# Patient Record
Sex: Female | Born: 1985 | Race: Black or African American | Hispanic: No | Marital: Single | Smoking: Never smoker
Health system: Southern US, Community
[De-identification: ages and names within clinical notes are randomized; demographics above are authoritative.]

## PROBLEM LIST (undated history)

## (undated) DIAGNOSIS — I10 Essential (primary) hypertension: Secondary | ICD-10-CM

## (undated) DIAGNOSIS — G5602 Carpal tunnel syndrome, left upper limb: Secondary | ICD-10-CM

## (undated) DIAGNOSIS — R519 Headache, unspecified: Secondary | ICD-10-CM

## (undated) DIAGNOSIS — F259 Schizoaffective disorder, unspecified: Secondary | ICD-10-CM

## (undated) DIAGNOSIS — G4700 Insomnia, unspecified: Secondary | ICD-10-CM

## (undated) DIAGNOSIS — E119 Type 2 diabetes mellitus without complications: Secondary | ICD-10-CM

## (undated) HISTORY — DX: Headache, unspecified: R51.9

## (undated) HISTORY — DX: Type 2 diabetes mellitus without complications: E11.9

## (undated) HISTORY — PX: WISDOM TOOTH EXTRACTION: SHX21

## (undated) HISTORY — DX: Schizoaffective disorder, unspecified: F25.9

## (undated) HISTORY — DX: Essential (primary) hypertension: I10

## (undated) HISTORY — DX: Carpal tunnel syndrome, left upper limb: G56.02

## (undated) HISTORY — DX: Insomnia, unspecified: G47.00

## (undated) HISTORY — PX: CARPAL TUNNEL RELEASE: SHX101

## (undated) HISTORY — PX: BREAST LUMPECTOMY: SHX2

---

## 2012-05-26 DIAGNOSIS — E119 Type 2 diabetes mellitus without complications: Secondary | ICD-10-CM

## 2012-05-26 HISTORY — DX: Type 2 diabetes mellitus without complications: E11.9

## 2012-07-26 ENCOUNTER — Encounter: Payer: Self-pay | Admitting: Obstetrics

## 2012-08-30 ENCOUNTER — Encounter: Payer: Self-pay | Admitting: Obstetrics & Gynecology

## 2012-09-24 ENCOUNTER — Encounter: Payer: Self-pay | Admitting: Obstetrics and Gynecology

## 2012-09-24 ENCOUNTER — Other Ambulatory Visit (HOSPITAL_COMMUNITY)
Admission: RE | Admit: 2012-09-24 | Discharge: 2012-09-24 | Disposition: A | Discharge status: Home / Self Care | Payer: Medicare Other | Source: Ambulatory Visit | Attending: Obstetrics and Gynecology | Admitting: Obstetrics and Gynecology

## 2012-09-24 ENCOUNTER — Ambulatory Visit (INDEPENDENT_AMBULATORY_CARE_PROVIDER_SITE_OTHER): Payer: Medicare Other | Admitting: Obstetrics and Gynecology

## 2012-09-24 VITALS — BP 112/78 | HR 95 | Temp 97.3°F | Wt 230.0 lb

## 2012-09-24 DIAGNOSIS — Z124 Encounter for screening for malignant neoplasm of cervix: Secondary | ICD-10-CM

## 2012-09-24 DIAGNOSIS — Z01419 Encounter for gynecological examination (general) (routine) without abnormal findings: Secondary | ICD-10-CM | POA: Insufficient documentation

## 2012-09-24 DIAGNOSIS — N912 Amenorrhea, unspecified: Secondary | ICD-10-CM

## 2012-09-24 DIAGNOSIS — N911 Secondary amenorrhea: Secondary | ICD-10-CM | POA: Insufficient documentation

## 2012-09-24 LAB — FOLLICLE STIMULATING HORMONE: FSH: 3 m[IU]/mL

## 2012-09-24 LAB — TSH: TSH: 1.108 u[IU]/mL (ref 0.350–4.500)

## 2012-09-24 MED ORDER — MEDROXYPROGESTERONE ACETATE 10 MG PO TABS: 10.0000 mg | ORAL_TABLET | Freq: Every day | ORAL | Status: DC

## 2012-09-24 NOTE — Progress Notes (Addendum)
  Subjective:    Patient ID: April [name], female    DOB: Jan 16, 1986, 27 y.o.   MRN: [id number]  HPI 27 yo G1P0101 with amenorrhea for several years presenting today for evaluation. Patient reports onset of menarche at 29. She states that she has always been regular with periods occuring on a monthly basis lasting 5-7 days. Patient states that after the birth of her son, 8 years ago, she started to experience irregular cycles. She would skip 2-3 months before having a normal period. Approximately 5 years ago, she became completely amenorrheic. She even reports onset of hot flushes and night sweats. Patient without any other complaints. She desires a pap smear today. Patient diagnosed with diabetes a year ago currently on oral glycemic agent. Patient with history of LEEP procedure. Patient has not been sexually active for at least 5 years. This is the first time the patient is being evaluated for this condition. UPT today is neagtive  Past Medical History  Diagnosis Date  . Diabetes mellitus without complication 05/2012   History reviewed. No pertinent past surgical history. Family History  Problem Relation Age of Onset  . Diabetes Maternal Aunt   . Diabetes Maternal Aunt    History  Substance Use Topics  . Smoking status: Never Smoker   . Smokeless tobacco: Never Used  . Alcohol Use: No      Review of Systems  All other systems reviewed and are negative.       Objective:   Physical Exam GENERAL: Well-developed, well-nourished female in no acute distress.  HEENT: Normocephalic, atraumatic. Sclerae anicteric.  NECK: Supple. Normal thyroid.  LUNGS: Clear to auscultation bilaterally.  HEART: Regular rate and rhythm. ABDOMEN: Soft, nontender, nondistended. Obese PELVIC: Normal external female genitalia. Vagina is pink and rugated.  Normal discharge. Normal appearing cervix. Uterus is normal in size. No adnexal mass or tenderness. EXTREMITIES: No cyanosis, clubbing, or edema, 2+  distal pulses.     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

## 2012-09-24 NOTE — Patient Instructions (Signed)
Secondary Amenorrhea   Secondary amenorrhea is the stopping of menstrual flow for 3 to 6 months in a female who has previously had periods. There are many possible causes. Most of these causes are not serious. Usually treating the underlying problem causing the loss of menses will return your periods to normal.  CAUSES   Some common and uncommon causes of not menstruating include:  · Malnutrition.  · Low blood sugar (hypoglycemia).  · Polycystic ovarian disease.  · Stress or fear.  · Breastfeeding.  · Hormone imbalance.  · Ovarian failure.  · Medications.  · Extreme obesity.  · Cystic fibrosis.  · Low body weight or drastic weight reduction from any cause.  · Early menopause.  · Removal of ovaries or uterus.  · Contraceptives.  · Illness.  · Long term (chronic) illnesses.  · Cushing's syndrome.  · Thyroid problems.  · Birth control pills, patches, or vaginal rings for birth control.  DIAGNOSIS   This diagnosis is made by your caregiver taking a medical history and doing a physical exam. Pregnancy must be ruled out. Often times, numerous blood tests of different hormones in the body may be measured. Urine testing may be done. Specialized x-rays may have to be done as well as measuring the body mass index (BMI).  TREATMENT   Treatment depends on the cause of the amenorrhea. If an eating disorder is present, this can be treated with an adequate diet and therapy. Chronic illnesses may improve with treatment of the illness. Overall, the outlook is good. The amenorrhea may be corrected with medications, lifestyle changes, or surgery. If the amenorrhea cannot be corrected, it is sometimes possible to create a false menstruation with medications.  Document Released: 05/23/2006 Document Revised: 07/04/2011 Document Reviewed: 03/30/2007  Exit[employer]® Patient Information ©2014 [employer].

## 2012-09-26 ENCOUNTER — Other Ambulatory Visit: Payer: Self-pay | Admitting: Obstetrics and Gynecology

## 2012-09-26 DIAGNOSIS — N911 Secondary amenorrhea: Secondary | ICD-10-CM

## 2012-09-28 ENCOUNTER — Ambulatory Visit (HOSPITAL_COMMUNITY): Payer: Medicare Other

## 2012-10-01 ENCOUNTER — Ambulatory Visit: Payer: Self-pay | Admitting: Obstetrics

## 2012-10-05 ENCOUNTER — Ambulatory Visit (HOSPITAL_COMMUNITY): Admission: RE | Admit: 2012-10-05 | Payer: Medicare Other | Source: Ambulatory Visit

## 2012-10-11 ENCOUNTER — Ambulatory Visit (HOSPITAL_COMMUNITY)
Admission: RE | Admit: 2012-10-11 | Discharge: 2012-10-11 | Disposition: A | Discharge status: Home / Self Care | Payer: Medicare Other | Source: Ambulatory Visit | Attending: Obstetrics and Gynecology | Admitting: Obstetrics and Gynecology

## 2012-10-11 DIAGNOSIS — N911 Secondary amenorrhea: Secondary | ICD-10-CM

## 2012-10-11 DIAGNOSIS — N912 Amenorrhea, unspecified: Secondary | ICD-10-CM | POA: Insufficient documentation

## 2012-10-11 DIAGNOSIS — E282 Polycystic ovarian syndrome: Secondary | ICD-10-CM | POA: Insufficient documentation

## 2012-10-19 ENCOUNTER — Encounter: Payer: Self-pay | Admitting: Obstetrics and Gynecology

## 2012-10-19 ENCOUNTER — Ambulatory Visit (INDEPENDENT_AMBULATORY_CARE_PROVIDER_SITE_OTHER): Payer: Medicare Other | Admitting: Obstetrics and Gynecology

## 2012-10-19 VITALS — BP 110/78 | HR 100 | Temp 98.0°F | Ht 64.0 in | Wt 234.7 lb

## 2012-10-19 DIAGNOSIS — Z01812 Encounter for preprocedural laboratory examination: Secondary | ICD-10-CM

## 2012-10-19 DIAGNOSIS — Z712 Person consulting for explanation of examination or test findings: Secondary | ICD-10-CM

## 2012-10-19 DIAGNOSIS — Z7189 Other specified counseling: Secondary | ICD-10-CM

## 2012-10-19 DIAGNOSIS — N912 Amenorrhea, unspecified: Secondary | ICD-10-CM

## 2012-10-19 DIAGNOSIS — Z30430 Encounter for insertion of intrauterine contraceptive device: Secondary | ICD-10-CM

## 2012-10-19 MED ORDER — LEVONORGESTREL 20 MCG/24HR IU IUD
INTRAUTERINE_SYSTEM | Freq: Once | INTRAUTERINE | Status: AC
  Administered 2012-10-19: 1 via INTRAUTERINE

## 2012-10-19 NOTE — Progress Notes (Signed)
Patient ID: April [name], female   DOB: 04-09-1986, 27 y.o.   MRN: [id number] 27 yo G1P0101 with LMP 10/08/2012 presenting today to discuss lab results for work up of secondary amenorrhea. Discussed lab results and concerns for prolactin levels in upper limits of normal which are suppressing ovarian function. Patient reports that one of her medication is causing her to have elevated prolactin and she is being followed for that in [facility]. At one point her levels were so high that she experienced milky breast discharge.  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  Urine pregnancy test negative.  Speculum placed in the vagina.  Cervix visualized.  Cleaned with Betadine x 2.  Grasped anteriorly with a single tooth tenaculum.  Uterus sounded to 7 cm.  Mirena IUD placed per manufacturer's recommendations.  Strings trimmed to 3 cm. Tenaculum was removed, good hemostasis noted.  Patient tolerated procedure well.   Patient given post procedure instructions and Mirena care card with expiration date.  Patient is asked to check IUD strings periodically and follow up in 4-6 weeks for IUD check.

## 2012-10-29 ENCOUNTER — Encounter: Payer: Self-pay | Admitting: *Deleted

## 2012-11-19 ENCOUNTER — Ambulatory Visit (INDEPENDENT_AMBULATORY_CARE_PROVIDER_SITE_OTHER): Payer: Medicare Other | Admitting: Obstetrics and Gynecology

## 2012-11-19 ENCOUNTER — Encounter: Payer: Self-pay | Admitting: Obstetrics and Gynecology

## 2012-11-19 VITALS — BP 123/80 | HR 89 | Temp 97.1°F | Ht 64.0 in | Wt 231.9 lb

## 2012-11-19 DIAGNOSIS — Z30431 Encounter for routine checking of intrauterine contraceptive device: Secondary | ICD-10-CM

## 2012-11-19 NOTE — Progress Notes (Signed)
Pt here for string check. Visualized with speculum. Present. Pt able to have intercourse at this time 4wks after insertion.  Pt complains of bleeding. Explained normal for IUD and should improve. No complaints of anemia sx.  Pt complains of cramping. Recommend monitor for up to 6 months as body returns to baseline.

## 2013-02-19 ENCOUNTER — Encounter: Payer: Medicare Other | Attending: *Deleted | Admitting: *Deleted

## 2013-02-19 ENCOUNTER — Encounter: Payer: Self-pay | Admitting: *Deleted

## 2013-02-19 VITALS — Ht 64.0 in | Wt 232.0 lb

## 2013-02-19 DIAGNOSIS — Z713 Dietary counseling and surveillance: Secondary | ICD-10-CM | POA: Insufficient documentation

## 2013-02-19 DIAGNOSIS — E669 Obesity, unspecified: Secondary | ICD-10-CM

## 2013-02-19 DIAGNOSIS — E119 Type 2 diabetes mellitus without complications: Secondary | ICD-10-CM

## 2013-02-19 NOTE — Progress Notes (Signed)
Appt start time: 0930 end time:  1030.  Assessment:  Patient was seen on  02/19/13 for individual diabetes education. She has been diagnosed for about 18 months and is not well-controlled.  She takes multiple medications, but her diet quality is poor, she is inactive, and she does not routinely monitor her blood glucose. April [name] is medically disabled and she lives with her mother.  They share shopping and cooking responsibilities.  Most of the foods are fried, but sometimes they bake.  They might eat out maybe once a week. April [name] has a very irregular sleep and eating pattern.  She sleeps from 2 am until 5 pm.  Then when she is awake she eats constantly.  Her diet is composed of excessive amount of refined carbohydrates   Current HbA1c: 8%  Preferred Learning Style:   Visual  Hands on   Learning Readiness:   Contemplating   MEDICATIONS: see list.  Invokana and Janumet for diabetes  DIETARY INTAKE:  Usual eating pattern includes multiple meals and multiple snacks per day.  Everyday foods include refined carbohydrates and fatty proteins.  Very seldom eats fruit or vegetables.    24-hr recall:  Sleeps until 5pm. Has bowl(s) cereal (honey nut chex with 2% milk) About 9 pm might have sandwich Will make another bowl of cereal  And another sandwich in the night.  Will stop eating around 2 am Drinks: sugar-free Hawaiian punch and water.  Diet coke with meals.  Sometimes sweet tea.  Her medications have her sleep schedule off.  She has tried to sleep normally,. But then a nap.    Usual physical activity: walks once every 2 weeks  Estimated energy needs: 1800 calories 200 g carbohydrates 135 g protein 50 g fat  Progress Towards Goal(s):  In progress.   Nutritional Diagnosis:  NB-1.5 Disordered eating pattern As related to erratic meals and limited nutritent content.  As evidenced by dietary recall, obesity, and DM.    Intervention:  Nutrition counseling provided.  Discussed  diabetes disease process and treatment options.  Discussed physiology of diabetes and role of obesity on insulin resistance.  Encouraged moderate weight reduction to improve glucose levels.  Discussed role of medications and diet in glucose control  Provided education on macronutrients on glucose levels.  Provided education on carb counting, importance of regularly scheduled meals/snacks, and meal planning  Discussed effects of physical activity on glucose levels and long-term glucose control.  Recommended 150 minutes of physical activity/week.  Reviewed patient medications.  Discussed role of medication on blood glucose and possible side effects  Discussed blood glucose monitoring and interpretation.  Discussed recommended target ranges and individual ranges.    Described short-term complications: hyper- and hypo-glycemia.  Discussed causes,symptoms, and treatment options.  Discussed prevention, detection, and treatment of long-term complications.  Discussed the role of prolonged elevated glucose levels on body systems.  Discussed recommendations for long-term diabetes self-care.  Established checklist for medical, dental, and emotional self-care.  Teaching Method Utilized:  Visual Auditory Hands on  Handouts given during visit include: Living Well with Diabetes Carb Counting and Food Label handouts Meal Plan Card  Barriers to learning/adherence to lifestyle change: schedule  Diabetes self-care support plan:   [facility] support group  mom  Demonstrated degree of understanding via:  Teach Back   Monitoring/Evaluation:  Dietary intake, exercise, BGM, and body weight in 3 month(s).

## 2013-05-22 ENCOUNTER — Encounter: Payer: Medicare Other | Attending: *Deleted | Admitting: *Deleted

## 2013-05-22 VITALS — Wt 225.0 lb

## 2013-05-22 DIAGNOSIS — E669 Obesity, unspecified: Secondary | ICD-10-CM | POA: Insufficient documentation

## 2013-05-22 DIAGNOSIS — Z713 Dietary counseling and surveillance: Secondary | ICD-10-CM | POA: Insufficient documentation

## 2013-05-22 DIAGNOSIS — E119 Type 2 diabetes mellitus without complications: Secondary | ICD-10-CM | POA: Insufficient documentation

## 2013-05-22 NOTE — Progress Notes (Signed)
Appt start time: 1600 end time:  1630.  Assessment:  April [name] is here for follow up diabetes education.   She is here with her young son and her mother, whom she lives with.  She reports that she Changed dietary habits: wheat bread, multigain cheerios.  However she has no made any other changes.  She still has a very erratic and irregular meal pattern and is inactive.  Her A1c dropped form 8%-7%   Current HbA1c: 7.0%  Preferred Learning Style:   Visual  Hands on   Learning Readiness:   Contemplating   MEDICATIONS: see list.  Invokana and Janumet for diabetes  DIETARY INTAKE:  Usual eating pattern includes multiple meals and multiple snacks per day.  Everyday foods include refined carbohydrates and fatty proteins.  Very seldom eats fruit or vegetables.    24-hr recall:  Sleeps until 6pm. Has sandwich and chips with diet soda and bowl cereal 8 pm Sugar-free Russel Stover pecan delights (3-4 candies) 10 pm chicken sandwich or cooks terryaki chicken and rice  Fruit smoothie 3 times a week  Her medications have her sleep schedule off.  She has tried to sleep normally,. But then a nap.    Usual physical activity: walks once every 2 weeks  Estimated energy needs: 1800 calories 200 g carbohydrates 135 g protein 50 g fat  Progress Towards Goal(s):  Some progress.   Nutritional Diagnosis:  NB-1.5 Disordered eating pattern As related to erratic meals and limited nutritent content.  As evidenced by dietary recall, obesity, and DM.    Intervention:  Nutrition counseling provided.  Discussed effects of physical activity on glucose levels and long-term glucose control.  Recommended 150 minutes of physical activity/week.  Teaching Method Utilized:  Auditory   Barriers to learning/adherence to lifestyle change: irregular schedule  Diabetes self-care support plan:  mom  Demonstrated degree of understanding via:  Teach Back   Monitoring/Evaluation:  Dietary intake,  exercise, BGM, and body weight in 3 month(s).

## 2013-08-20 ENCOUNTER — Encounter: Payer: Medicare Other | Attending: *Deleted | Admitting: *Deleted

## 2013-08-20 VITALS — Wt 230.8 lb

## 2013-08-20 DIAGNOSIS — Z713 Dietary counseling and surveillance: Secondary | ICD-10-CM | POA: Insufficient documentation

## 2013-08-20 DIAGNOSIS — E669 Obesity, unspecified: Secondary | ICD-10-CM | POA: Insufficient documentation

## 2013-08-20 DIAGNOSIS — E119 Type 2 diabetes mellitus without complications: Secondary | ICD-10-CM | POA: Insufficient documentation

## 2013-08-20 NOTE — Patient Instructions (Signed)
Add lettuce and tomato on sandwich Add cantaloupe as a side  Keep up the great work!!! Aim to walk 3 days And zumba once a week

## 2013-08-20 NOTE — Progress Notes (Signed)
Appt start time: 1600 end time:  1630.  Assessment:  April [name] is here for follow up diabetes education.   Stopped eating after midnight and increase physical activity: Zumba and walking.  She states she feels good!!  She thinks she has lost weight.  She has started checking her blood glucose and her numbers are WNL.  Her most recent HbA1c is 6.2%!!!  She started snacking on nuts instead of sugar-free candy.  She started eating whole grain bread and leaner sandwich meats.    She reports that her attitude has changed  Preferred Learning Style:   Visual  Hands on   Learning Readiness:   Change in progress   MEDICATIONS: see list.  Invokana and Janumet for diabetes  DIETARY INTAKE:  Usual eating pattern includes multiple meals and multiple snacks per day.  Everyday foods include carbohydrates and proteins.  Very seldom eats fruit or vegetables.    24-hr recall:  Wake up earlier around 4: cereal or sandwich Might walk or go to Zumba Take a nap Around 9 pm: chicken sandwich on whole grain.  Some kind of sandwich Goes back to sleep Beverages: water or G2 or crystal ligjt  Usual physical activity: walks more and goes to zumba  Estimated energy needs: 1800 calories 200 g carbohydrates 135 g protein 50 g fat  Progress Towards Goal(s):  Some progress.   Nutritional Diagnosis:  NB-1.5 Disordered eating pattern As related to erratic meals and limited nutritent content.  As evidenced by dietary recall, obesity, and DM.    Intervention:  Nutrition counseling provided. Goals: Add lettuce and tomato on sandwich Add cantaloupe as a side Keep up the great work!!! Aim to walk 3 days And zumba once a week  Teaching Method Utilized:  Auditory   Barriers to learning/adherence to lifestyle change: irregular schedule, sleepiness. motivation  Diabetes self-care support plan:  mom  Demonstrated degree of understanding via:  Teach Back   Monitoring/Evaluation:  Dietary intake,  exercise, BGM, and body weight in 3 month(s).

## 2013-11-19 ENCOUNTER — Encounter: Payer: Medicare Other | Attending: *Deleted | Admitting: *Deleted

## 2013-11-19 DIAGNOSIS — Z713 Dietary counseling and surveillance: Secondary | ICD-10-CM | POA: Insufficient documentation

## 2013-11-19 DIAGNOSIS — E119 Type 2 diabetes mellitus without complications: Secondary | ICD-10-CM | POA: Insufficient documentation

## 2013-11-19 DIAGNOSIS — E669 Obesity, unspecified: Secondary | ICD-10-CM | POA: Insufficient documentation

## 2013-11-19 NOTE — Patient Instructions (Signed)
Aim to walk 4 days each week Wait 30 minutes after you eat before getting cereal.  If you're not hungry, don't eat it! Limit to 1 bowl of cereal if you do eat some

## 2013-11-19 NOTE — Progress Notes (Signed)
Appt start time: 1600 end time:  1630.  Assessment:  April [name] is here with her mom and son for follow up nutrition counseling pertaining to type 2 diabetes.  Her most recent A1c is 7%, up from 6.2%.  Her mom is dissapointed.  April [name] thinks her glucose went up because she's been eating sweets daily.  She eats "the right portion, but just too often."  I doubt her portion is small because her portions of all her other foods are large.  She is mostly inactive, sleeps all day, and eats carbohydrates all through the night.  Mom tries to motivate her and help her, but April [name] has been reluctant to make changes.      Preferred Learning Style:   Visual  Hands on   Learning Readiness:   Change in progress   MEDICATIONS: see list.  Invokana and Janumet for diabetes  DIETARY INTAKE:  Usual eating pattern includes multiple meals and multiple snacks per day.  Everyday foods include carbohydrates and proteins.  Very seldom eats fruit or vegetables.    24-hr recall:  Wake up earlier around 4:  Later has chicken salad or terriyaki chicken and rice or sandwich ([place] and cheese with honey mustard on whole wheat bread) with sugar-free beverage Couple bowls cereal or sandwich Might walk or go to Zumba once a week Around 9 pm: chicken sandwich on whole grain.  Some kind of sandwich More cereal Goes back to sleep Beverages: water or G2 or crystal ligjt  Usual physical activity: walks once a week  Estimated energy needs: 1800 calories 200 g carbohydrates 135 g protein 50 g fat  Progress Towards Goal(s):  Some progress.   Nutritional Diagnosis:  NB-1.5 Disordered eating pattern As related to erratic meals and limited nutritent content.  As evidenced by dietary recall, obesity, and DM.    Intervention:  Nutrition counseling provided.  Discussed MyPlate recommendations and reviewed what food affect glucose (ie carbohydrates).  Discussed carb count of her favorite foods (cereal, milk, and  sandwiches).  April [name] was amazed at how many carb she's consuming at night.  Discussed possibility of her waking up and eating balanced breakfast, going back to sleep, getting balanced lunch, taking nap if needed, and then balanced dinner.  Reiterated importance of physical activity.  Mom agrees to walk with her to keep her motivated.     Goals: Aim to walk 4 days each week Wait 30 minutes after you eat before getting cereal.  If you're not hungry, don't eat it! Limit to 1 bowl of cereal if you do eat some  Teaching Method Utilized:  Auditory  Handout given: MyPlate for Diabetes   Barriers to learning/adherence to lifestyle change: irregular schedule, sleepiness. motivation  Diabetes self-care support plan:  mom  Demonstrated degree of understanding via:  Teach Back   Monitoring/Evaluation:  Dietary intake, exercise, BGM, and body weight in 3 month(s).

## 2014-02-17 ENCOUNTER — Ambulatory Visit: Payer: Medicare Other | Admitting: *Deleted

## 2014-02-24 ENCOUNTER — Encounter: Payer: Self-pay | Admitting: *Deleted

## 2014-02-24 ENCOUNTER — Ambulatory Visit: Payer: Medicare Other | Admitting: *Deleted

## 2016-01-28 ENCOUNTER — Ambulatory Visit (INDEPENDENT_AMBULATORY_CARE_PROVIDER_SITE_OTHER): Payer: Medicare Other | Admitting: Obstetrics and Gynecology

## 2016-01-28 ENCOUNTER — Other Ambulatory Visit (HOSPITAL_COMMUNITY)
Admission: RE | Admit: 2016-01-28 | Discharge: 2016-01-28 | Disposition: A | Discharge status: Home / Self Care | Payer: Medicare Other | Source: Ambulatory Visit | Attending: Obstetrics and Gynecology | Admitting: Obstetrics and Gynecology

## 2016-01-28 ENCOUNTER — Encounter: Payer: Self-pay | Admitting: Obstetrics and Gynecology

## 2016-01-28 VITALS — BP 119/75 | HR 98 | Wt 225.0 lb

## 2016-01-28 DIAGNOSIS — Z1151 Encounter for screening for human papillomavirus (HPV): Secondary | ICD-10-CM

## 2016-01-28 DIAGNOSIS — Z113 Encounter for screening for infections with a predominantly sexual mode of transmission: Secondary | ICD-10-CM

## 2016-01-28 DIAGNOSIS — Z01419 Encounter for gynecological examination (general) (routine) without abnormal findings: Secondary | ICD-10-CM

## 2016-01-28 DIAGNOSIS — Z124 Encounter for screening for malignant neoplasm of cervix: Secondary | ICD-10-CM

## 2016-01-28 NOTE — Progress Notes (Signed)
Subjective:     Charlcie Rubye OaksDickerson is a 30 y.o. female G1P1 with BMI 6638 who is here for a comprehensive physical exam. The patient reports no problems. She is not sexually active. She is using Mirena for contraception and cycle control. She has been amenorrheic since its insertion in 2014. Patient denies any abdominal/pelvic pain or abnormal discharge  Past Medical History:  Diagnosis Date  . Diabetes mellitus without complication (HCC) 05/2012  . Hypertension    No past surgical history on file. Family History  Problem Relation Age of Onset  . Diabetes Maternal Aunt   . Diabetes Maternal Aunt     Social History   Social History  . Marital status: Single    Spouse name: N/A  . Number of children: N/A  . Years of education: N/A   Occupational History  . Not on file.   Social History Main Topics  . Smoking status: Never Smoker  . Smokeless tobacco: Never Used  . Alcohol use No  . Drug use: No  . Sexual activity: No   Other Topics Concern  . Not on file   Social History Narrative  . No narrative on file   Health Maintenance  Topic Date Due  . HEMOGLOBIN A1C  06-18-1984  . PNEUMOCOCCAL POLYSACCHARIDE VACCINE (1) 07/28/1986  . FOOT EXAM  07/28/1994  . OPHTHALMOLOGY EXAM  07/28/1994  . HIV Screening  07/28/1999  . TETANUS/TDAP  07/28/2003  . PAP SMEAR  09/25/2015  . INFLUENZA VACCINE  11/24/2015       Review of Systems Pertinent items are noted in HPI.   Objective:  Blood pressure 119/75, pulse 98, weight 225 lb (102.1 kg).     GENERAL: Well-developed, well-nourished female in no acute distress.  HEENT: Normocephalic, atraumatic. Sclerae anicteric.  NECK: Supple. Normal thyroid.  LUNGS: Clear to auscultation bilaterally.  HEART: Regular rate and rhythm. BREASTS: Symmetric in size. No palpable masses or lymphadenopathy, skin changes, or nipple drainage. ABDOMEN: Soft, nontender, nondistended. No organomegaly. PELVIC: Normal external female genitalia. Vagina  is pink and rugated.  Normal discharge. Normal appearing cervix. Uterus is normal in size. No adnexal mass or tenderness. EXTREMITIES: No cyanosis, clubbing, or edema, 2+ distal pulses.    Assessment:    Healthy female exam.      Plan:    Pap smear collected Patient desires STI testing which was ordered and collected Patient will be contacted with any abnormal results Discussed weight loss strategies RTC prn See After Visit Summary for Counseling Recommendations

## 2016-01-29 LAB — RPR

## 2016-01-29 LAB — CYTOLOGY - PAP

## 2016-01-29 LAB — HEPATITIS B SURFACE ANTIGEN: Hepatitis B Surface Ag: NEGATIVE

## 2016-01-29 LAB — HIV ANTIBODY (ROUTINE TESTING W REFLEX): HIV: NONREACTIVE

## 2017-11-09 ENCOUNTER — Ambulatory Visit (INDEPENDENT_AMBULATORY_CARE_PROVIDER_SITE_OTHER): Payer: Medicare Other | Admitting: Obstetrics and Gynecology

## 2017-11-09 ENCOUNTER — Encounter: Payer: Self-pay | Admitting: Obstetrics and Gynecology

## 2017-11-09 ENCOUNTER — Other Ambulatory Visit (HOSPITAL_COMMUNITY)
Admission: RE | Admit: 2017-11-09 | Discharge: 2017-11-09 | Disposition: A | Discharge status: Home / Self Care | Payer: Medicare Other | Source: Ambulatory Visit | Attending: Obstetrics and Gynecology | Admitting: Obstetrics and Gynecology

## 2017-11-09 VITALS — BP 121/97 | HR 89 | Ht 64.0 in | Wt 234.0 lb

## 2017-11-09 DIAGNOSIS — Z30430 Encounter for insertion of intrauterine contraceptive device: Secondary | ICD-10-CM | POA: Diagnosis not present

## 2017-11-09 DIAGNOSIS — Z01419 Encounter for gynecological examination (general) (routine) without abnormal findings: Secondary | ICD-10-CM | POA: Diagnosis present

## 2017-11-09 DIAGNOSIS — Z975 Presence of (intrauterine) contraceptive device: Secondary | ICD-10-CM

## 2017-11-09 DIAGNOSIS — Z202 Contact with and (suspected) exposure to infections with a predominantly sexual mode of transmission: Secondary | ICD-10-CM

## 2017-11-09 LAB — POCT PREGNANCY, URINE: PREG TEST UR: NEGATIVE

## 2017-11-09 MED ORDER — LEVONORGESTREL 19.5 MCG/DAY IU IUD
INTRAUTERINE_SYSTEM | Freq: Once | INTRAUTERINE | Status: AC
  Administered 2017-11-09: 15:00:00 via INTRAUTERINE

## 2017-11-09 NOTE — Addendum Note (Signed)
Addended by: Ernestina PatchesAPEL, Chaquana Nichols S on: 11/09/2017 03:24 PM   Modules accepted: Orders

## 2017-11-09 NOTE — Progress Notes (Signed)
Subjective:     April [name] is a 32 y.o. female P1 with BMI 40 and Mirena-IUD induced amnorrhea who is here for a comprehensive physical exam. The patient reports no problems. She is sexually active using IUD for contraception. IUD is due to removal and patient desires a re-insertion. Patient denies any pelvic pain or abnormal discharge.  Past Medical History:  Diagnosis Date  . Diabetes mellitus without complication (HCC) 05/2012  . Hypertension    No past surgical history on file. Family History  Problem Relation Age of Onset  . Diabetes Maternal Aunt   . Diabetes Maternal Aunt      Social History   Socioeconomic History  . Marital status: Single    Spouse name: Not on file  . Number of children: Not on file  . Years of education: Not on file  . Highest education level: Not on file  Occupational History  . Not on file  Social Needs  . Financial resource strain: Not on file  . Food insecurity:    Worry: Not on file    Inability: Not on file  . Transportation needs:    Medical: Not on file    Non-medical: Not on file  Tobacco Use  . Smoking status: Never Smoker  . Smokeless tobacco: Never Used  Substance and Sexual Activity  . Alcohol use: No  . Drug use: No  . Sexual activity: Never    Birth control/protection: None  Lifestyle  . Physical activity:    Days per week: Not on file    Minutes per session: Not on file  . Stress: Not on file  Relationships  . Social connections:    Talks on phone: Not on file    Gets together: Not on file    Attends religious service: Not on file    Active member of club or organization: Not on file    Attends meetings of clubs or organizations: Not on file    Relationship status: Not on file  . Intimate partner violence:    Fear of current or ex partner: Not on file    Emotionally abused: Not on file    Physically abused: Not on file    Forced sexual activity: Not on file  Other Topics Concern  . Not on file  Social  History Narrative  . Not on file   Health Maintenance  Topic Date Due  . HEMOGLOBIN A1C  [birth year]  . PNEUMOCOCCAL POLYSACCHARIDE VACCINE (1) 07/28/1986  . FOOT EXAM  07/28/1994  . OPHTHALMOLOGY EXAM  07/28/1994  . TETANUS/TDAP  07/28/2003  . INFLUENZA VACCINE  11/23/2017  . PAP SMEAR  01/28/2019  . HIV Screening  Completed       Review of Systems Pertinent items are noted in HPI.   Objective:  Blood pressure (!) 121/97, pulse 89, height 5\' 4"  (1.626 m), weight 234 lb (106.1 kg). GENERAL: Well-developed, well-nourished female in no acute distress.  HEENT: Normocephalic, atraumatic. Sclerae anicteric.  NECK: Supple. Normal thyroid.  LUNGS: Clear to auscultation bilaterally.  HEART: Regular rate and rhythm. BREASTS: Symmetric in size. No palpable masses or lymphadenopathy, skin changes, or nipple drainage. ABDOMEN: Soft, nontender, nondistended. No organomegaly. PELVIC: Normal external female genitalia. Vagina is pink and rugated.  Normal discharge. Normal appearing cervix with IUD strings visualized at the os. Uterus is normal in size. No adnexal mass or tenderness. EXTREMITIES: No cyanosis, clubbing, or edema, 2+ distal pulses.       Assessment:    Healthy female  exam.      Plan:    Pap smear collected IUD Removal  Patient identified, informed consent performed, consent signed.  Patient was in the dorsal lithotomy position, normal external genitalia was noted.  A speculum was placed in the patient's vagina, normal discharge was noted, no lesions. The cervix was visualized, no lesions, no abnormal discharge.  The strings of the IUD were grasped and pulled using ring forceps. The IUD was removed in its entirety. Patient tolerated the procedure well.    IUD Insertion Patient identified, informed consent performed, signed copy in chart, time out was performed.  Urine pregnancy test negative.  Speculum placed in the vagina.  Cervix visualized.  Cleaned with Betadine x 2.   Grasped anteriorly with a single tooth tenaculum.  Uterus sounded to 8 cm.  Mirena IUD placed per manufacturer's recommendations.  Strings trimmed to 3 cm. Tenaculum was removed, good hemostasis noted.  Patient tolerated procedure well.   Patient given post procedure instructions and Mirena care card with expiration date.  Patient is asked to check IUD strings periodically and follow up in 4-6 weeks for IUD check.     See After Visit Summary for Counseling Recommendations

## 2017-11-10 LAB — CYTOLOGY - PAP
Chlamydia: NEGATIVE
DIAGNOSIS: NEGATIVE
HPV (WINDOPATH): NOT DETECTED
NEISSERIA GONORRHEA: NEGATIVE
TRICH (WINDOWPATH): NEGATIVE

## 2017-11-10 LAB — HEPATITIS B SURFACE ANTIGEN: Hepatitis B Surface Ag: NEGATIVE

## 2017-11-10 LAB — RPR: RPR Ser Ql: NONREACTIVE

## 2017-11-10 LAB — HEPATITIS C ANTIBODY: Hep C Virus Ab: <0.1 s/co ratio (ref 0.0–0.9)

## 2017-11-10 LAB — HIV ANTIBODY (ROUTINE TESTING W REFLEX): HIV SCREEN 4TH GENERATION: NONREACTIVE

## 2017-12-14 ENCOUNTER — Encounter: Payer: Self-pay | Admitting: Obstetrics and Gynecology

## 2017-12-14 ENCOUNTER — Ambulatory Visit: Payer: Medicare Other | Admitting: Obstetrics and Gynecology

## 2017-12-14 VITALS — BP 136/89 | HR 76 | Ht 64.0 in | Wt 243.0 lb

## 2017-12-14 DIAGNOSIS — Z30431 Encounter for routine checking of intrauterine contraceptive device: Secondary | ICD-10-CM

## 2017-12-14 NOTE — Progress Notes (Signed)
32 yo G1P1 here for IUD check. Patient had IUD re-inserted on 11/09/2017. Patient reports feeling well. She denies pelvic pain or abnormal discharge. Patient has not been sexually active as she is no longer in a relationship  Past Medical History:  Diagnosis Date  . Diabetes mellitus without complication (HCC) 05/2012  . Hypertension    No past surgical history on file. Family History  Problem Relation Age of Onset  . Diabetes Maternal Aunt   . Diabetes Maternal Aunt    Social History   Tobacco Use  . Smoking status: Never Smoker  . Smokeless tobacco: Never Used  Substance Use Topics  . Alcohol use: No  . Drug use: No   ROS See pertinent in HPI  Blood pressure 136/89, pulse 76, height 5\' 4"  (1.626 m), weight 243 lb (110.2 kg).  GENERAL: Well-developed, well-nourished female in no acute distress.  ABDOMEN: Soft, nontender, nondistended. No organomegaly. PELVIC: Normal external female genitalia. Vagina is pink and rugated.  Normal discharge. Normal appearing cervix with IUD strings visualized extending 2 cm from os. Uterus is normal in size. No adnexal mass or tenderness. EXTREMITIES: No cyanosis, clubbing, or edema, 2+ distal pulses.  A/P 32 yo here for IUD check - IUD appears to be in appropriate location - patient with normal pap smear 10/2017 - RTC prn

## 2018-11-09 ENCOUNTER — Telehealth: Payer: Self-pay | Admitting: Obstetrics & Gynecology

## 2018-11-09 ENCOUNTER — Encounter

## 2018-11-09 NOTE — Telephone Encounter (Signed)
The patient called in stating she is experiencing issues with her IUD and irregular bleeding. Informed the patient of the next available appointment. The patient stated she can only come in on Fridays. She requested to be placed on the wait list and she also stated she will continue to call the office for available appointments.

## 2018-12-05 ENCOUNTER — Telehealth: Payer: Self-pay | Admitting: Obstetrics and Gynecology

## 2018-12-05 ENCOUNTER — Ambulatory Visit: Payer: Medicare Other | Admitting: Obstetrics and Gynecology

## 2018-12-05 NOTE — Telephone Encounter (Signed)
Spoke with patient about her appointment, and no visitors.

## 2018-12-06 ENCOUNTER — Other Ambulatory Visit (HOSPITAL_COMMUNITY)
Admission: RE | Admit: 2018-12-06 | Discharge: 2018-12-06 | Disposition: A | Discharge status: Home / Self Care | Payer: Medicare Other | Source: Ambulatory Visit | Attending: Obstetrics and Gynecology | Admitting: Obstetrics and Gynecology

## 2018-12-06 ENCOUNTER — Encounter: Payer: Self-pay | Admitting: Obstetrics and Gynecology

## 2018-12-06 ENCOUNTER — Other Ambulatory Visit: Payer: Self-pay

## 2018-12-06 ENCOUNTER — Ambulatory Visit (INDEPENDENT_AMBULATORY_CARE_PROVIDER_SITE_OTHER): Payer: Medicare Other | Admitting: Obstetrics and Gynecology

## 2018-12-06 VITALS — BP 124/76 | HR 79 | Wt 242.6 lb

## 2018-12-06 DIAGNOSIS — N930 Postcoital and contact bleeding: Secondary | ICD-10-CM | POA: Insufficient documentation

## 2018-12-06 NOTE — Progress Notes (Signed)
33 yo P0101 here for postcoital vaginal bleeding. Patient is sexually active with both men and women. She reports 2 occasions when following intercourse with her female partner she experienced vaginal bleeding during intercourse. They were not using toys and were not particularly aggresive. Patient had IUD place on 11/09/2017. Patient denies any pelvic pain. She reports recent treatment for yeast infection using over the counter monistat. Patient has been amenorrheic with IUD  Past Medical History:  Diagnosis Date  . Diabetes mellitus without complication ([place]) 0/2725  . Hypertension    No past surgical history on file. Family History  Problem Relation Age of Onset  . Diabetes Maternal Aunt   . Diabetes Maternal Aunt    Social History   Tobacco Use  . Smoking status: Never Smoker  . Smokeless tobacco: Never Used  Substance Use Topics  . Alcohol use: No  . Drug use: No   ROS See pertinent in HPI  Blood pressure 124/76, pulse 79, weight 242 lb 9.6 oz (110 kg).  GENERAL: Well-developed, well-nourished female in no acute distress.  ABDOMEN: Soft, nontender, nondistended. No organomegaly. PELVIC: Normal external female genitalia. Vagina is pink and rugated.  Normal discharge. Normal appearing cervix with IUD strings visualized extending 2 cm from os. Uterus is normal in size. No adnexal mass or tenderness. EXTREMITIES: No cyanosis, clubbing, or edema, 2+ distal pulses.  A/P 33 yo with postcoital bleeding - vaginal cultures collected to rule out STI or yeast/BV. Patient declined blood work - reassurance provided as IUD appears to be in the appropriate location - patient will be contacted with abnormal results - Patient with normal pap smear 10/2017 - RTC prn

## 2018-12-08 LAB — CERVICOVAGINAL ANCILLARY ONLY
Bacterial vaginitis: POSITIVE — AB
Candida vaginitis: POSITIVE — AB
Chlamydia: NEGATIVE
Neisseria Gonorrhea: NEGATIVE
Trichomonas: NEGATIVE

## 2018-12-10 MED ORDER — FLUCONAZOLE 150 MG PO TABS: 150.0000 mg | ORAL_TABLET | Freq: Once | ORAL | 0 refills | Status: AC

## 2018-12-10 MED ORDER — METRONIDAZOLE 500 MG PO TABS: 500.0000 mg | ORAL_TABLET | Freq: Two times a day (BID) | ORAL | 0 refills | Status: DC

## 2018-12-10 NOTE — Addendum Note (Signed)
Addended by: Mora Bellman on: 12/10/2018 10:16 AM   Modules accepted: Orders

## 2018-12-11 ENCOUNTER — Telehealth: Payer: Self-pay

## 2018-12-11 MED ORDER — FLUCONAZOLE 150 MG PO TABS: 150.0000 mg | ORAL_TABLET | Freq: Once | ORAL | 0 refills | Status: AC

## 2018-12-11 NOTE — Telephone Encounter (Addendum)
-----   Message from Mora Bellman, MD sent at 12/10/2018 10:15 AM EDT ----- Please inform patient of both BV and yeast infection. Rx has been e-prescribed  Pt informed of BV and yeast infection and that tx has been sent to her [facility] on [address]  I explained to the pt that she will take Flagyl twice a day for seven days.  I also ordered Diflucan for her in case she gets a yeast infection after taking the Flagyl.  Pt verbalized understanding.

## 2020-01-02 ENCOUNTER — Other Ambulatory Visit: Payer: Self-pay

## 2020-01-02 ENCOUNTER — Encounter: Payer: Self-pay | Admitting: Obstetrics and Gynecology

## 2020-01-02 ENCOUNTER — Other Ambulatory Visit (HOSPITAL_COMMUNITY)
Admission: RE | Admit: 2020-01-02 | Discharge: 2020-01-02 | Disposition: A | Discharge status: Home / Self Care | Payer: Medicare Other | Source: Ambulatory Visit | Attending: Obstetrics and Gynecology | Admitting: Obstetrics and Gynecology

## 2020-01-02 ENCOUNTER — Ambulatory Visit (INDEPENDENT_AMBULATORY_CARE_PROVIDER_SITE_OTHER): Payer: Medicare Other | Admitting: Obstetrics and Gynecology

## 2020-01-02 VITALS — BP 113/77 | HR 81 | Wt 233.6 lb

## 2020-01-02 DIAGNOSIS — Z1151 Encounter for screening for human papillomavirus (HPV): Secondary | ICD-10-CM | POA: Diagnosis not present

## 2020-01-02 DIAGNOSIS — Z114 Encounter for screening for human immunodeficiency virus [HIV]: Secondary | ICD-10-CM

## 2020-01-02 DIAGNOSIS — Z124 Encounter for screening for malignant neoplasm of cervix: Secondary | ICD-10-CM

## 2020-01-02 DIAGNOSIS — Z113 Encounter for screening for infections with a predominantly sexual mode of transmission: Secondary | ICD-10-CM | POA: Diagnosis present

## 2020-01-02 NOTE — Progress Notes (Signed)
Pt is here for STD testing today. Pt thinks she may have been exposed to HIV 2-3 weeks ago. Pt reports she is also experiencing symptoms of vaginal itching and irritation.

## 2020-01-02 NOTE — Progress Notes (Signed)
34 yo P1 presenting today for STI testing. Patient reports vaginal pruritis for a few weeks. She states she has a history of recurrent yeast and BV infections. She also reports known exposure to HIV 3 weeks ago. Patient is without any other complaints. She is using Mirena IUD for contraception and has been ammenorheic since its insertion in 2019  Past Medical History:  Diagnosis Date  . Diabetes mellitus without complication (HCC) 05/2012  . Hypertension    No past surgical history on file. Family History  Problem Relation Age of Onset  . Diabetes Maternal Aunt   . Diabetes Maternal Aunt    Social History   Tobacco Use  . Smoking status: Never Smoker  . Smokeless tobacco: Never Used  Substance Use Topics  . Alcohol use: No  . Drug use: No   ROS See pertinent in HPI. All other systems reviewed and negative  Blood pressure 113/77, pulse 81, weight 233 lb 9.6 oz (106 kg). GENERAL: Well-developed, well-nourished female in no acute distress.  ABDOMEN: Soft, nontender, nondistended. No organomegaly. PELVIC: Normal external female genitalia. Vagina is pink and rugated.  Normal discharge. Normal appearing cervix. IUD strings visualized extending 2 cm from os. Uterus is normal in size. No adnexal mass or tenderness. EXTREMITIES: No cyanosis, clubbing, or edema, 2+ distal pulses.  A/P 34 yo here for STI testing - Pap smear collected - STI testing today - Patient will be contacted with abnormal results - Patient encouraged to repeat HIV test in 3 months - RTC prn

## 2020-01-03 LAB — CERVICOVAGINAL ANCILLARY ONLY
Bacterial Vaginitis (gardnerella): POSITIVE — AB
Candida Glabrata: POSITIVE — AB
Candida Vaginitis: POSITIVE — AB
Chlamydia: NEGATIVE
Comment: NEGATIVE
Comment: NEGATIVE
Comment: NEGATIVE
Comment: NEGATIVE
Comment: NEGATIVE
Comment: NORMAL
Neisseria Gonorrhea: NEGATIVE
Trichomonas: NEGATIVE

## 2020-01-03 LAB — HIV ANTIBODY (ROUTINE TESTING W REFLEX): HIV Screen 4th Generation wRfx: NONREACTIVE

## 2020-01-03 LAB — HEPATITIS B SURFACE ANTIGEN: Hepatitis B Surface Ag: NEGATIVE

## 2020-01-03 LAB — HEPATITIS C ANTIBODY: Hep C Virus Ab: 0.2 s/co ratio (ref 0.0–0.9)

## 2020-01-03 LAB — RPR: RPR Ser Ql: NONREACTIVE

## 2020-01-06 ENCOUNTER — Telehealth: Payer: Self-pay

## 2020-01-06 LAB — CYTOLOGY - PAP
Adequacy: ABSENT
Comment: NEGATIVE
Diagnosis: NEGATIVE
High risk HPV: NEGATIVE

## 2020-01-06 MED ORDER — METRONIDAZOLE 500 MG PO TABS: 500.0000 mg | ORAL_TABLET | Freq: Two times a day (BID) | ORAL | 0 refills | Status: DC

## 2020-01-06 MED ORDER — FLUCONAZOLE 150 MG PO TABS: 150.0000 mg | ORAL_TABLET | Freq: Once | ORAL | 0 refills | Status: AC

## 2020-01-06 NOTE — Addendum Note (Signed)
Addended by: Catalina Antigua on: 01/06/2020 11:01 AM   Modules accepted: Orders

## 2020-01-06 NOTE — Telephone Encounter (Signed)
-----   Message from Catalina Antigua, MD sent at 01/06/2020 11:01 AM EDT ----- Please inform patient of both yeast and BV infections. Rx e-prescribed

## 2020-01-06 NOTE — Telephone Encounter (Signed)
Patient has been informed of test results and the need to pick up prescription at her pharmacy.

## 2020-01-16 ENCOUNTER — Other Ambulatory Visit: Payer: Self-pay

## 2020-01-16 MED ORDER — FLUCONAZOLE 150 MG PO TABS: 150.0000 mg | ORAL_TABLET | Freq: Once | ORAL | 0 refills | Status: DC

## 2020-01-16 NOTE — Progress Notes (Signed)
Diflucan sent to pt's pharmacy per protocol for symptoms of vaginal yeast, itching and discharge. Advised pt to call back if symptoms do not improve and we will need to see her in office for evaluation, pt voices understanding.

## 2020-02-03 ENCOUNTER — Other Ambulatory Visit: Payer: Self-pay

## 2020-02-03 MED ORDER — FLUCONAZOLE 150 MG PO TABS: 150.0000 mg | ORAL_TABLET | Freq: Once | ORAL | 0 refills | Status: AC

## 2020-02-03 NOTE — Progress Notes (Signed)
Diflucan Rx sent to pts pharmacy per protocol for yeast infection symptoms.

## 2020-02-17 ENCOUNTER — Telehealth: Payer: Self-pay | Admitting: *Deleted

## 2020-02-17 NOTE — Telephone Encounter (Signed)
Pt called to office stating she needs another Diflucan to treat yeast infection.   Attempt to contact pt. No answer, LVM.

## 2020-02-18 ENCOUNTER — Other Ambulatory Visit: Payer: Self-pay | Admitting: *Deleted

## 2020-02-18 MED ORDER — TERCONAZOLE 0.4 % VA CREA: 1.0000 | TOPICAL_CREAM | Freq: Every day | VAGINAL | 0 refills | Status: DC

## 2020-02-18 NOTE — Progress Notes (Signed)
Pt called to office with complaints of vaginal yeast infection. Pt states she took Diflucan a few weeks ago but is now having vaginal/labial itching and signs of yeast.  Pt is scheduled to see provider regarding problem in November.  Advised could send Terazol today for pt to use.  If no relief of symptoms, will need to have appt for further eval.   Pt states understanding.

## 2020-03-02 ENCOUNTER — Other Ambulatory Visit (HOSPITAL_COMMUNITY)
Admission: RE | Admit: 2020-03-02 | Discharge: 2020-03-02 | Disposition: A | Discharge status: Home / Self Care | Payer: Medicare Other | Source: Ambulatory Visit | Attending: Obstetrics and Gynecology | Admitting: Obstetrics and Gynecology

## 2020-03-02 ENCOUNTER — Ambulatory Visit (INDEPENDENT_AMBULATORY_CARE_PROVIDER_SITE_OTHER): Payer: Medicare Other | Admitting: Obstetrics and Gynecology

## 2020-03-02 ENCOUNTER — Other Ambulatory Visit: Payer: Self-pay

## 2020-03-02 ENCOUNTER — Encounter: Payer: Self-pay | Admitting: Obstetrics and Gynecology

## 2020-03-02 VITALS — BP 118/82 | HR 84 | Ht 64.0 in | Wt 239.0 lb

## 2020-03-02 DIAGNOSIS — Z6841 Body Mass Index (BMI) 40.0 and over, adult: Secondary | ICD-10-CM | POA: Diagnosis not present

## 2020-03-02 DIAGNOSIS — N760 Acute vaginitis: Secondary | ICD-10-CM

## 2020-03-02 NOTE — Progress Notes (Signed)
34 yo P1 presenting today for the evaluation of vaginitis. Patient was recently treated in September for yeast and BV. Patient reports return of vaginitis a few weeks later and was recently treated for yeast infection again. Patient is not currently sexually active. Patient has had Mirena IUD for several years. She reports improvement in her A1C down to 6.1. Patient reports return of vaginal pruritis  Past Medical History:  Diagnosis Date  . Diabetes mellitus without complication (HCC) 05/2012  . Hypertension    No past surgical history on file. Family History  Problem Relation Age of Onset  . Diabetes Maternal Aunt   . Diabetes Maternal Aunt    Social History   Tobacco Use  . Smoking status: Never Smoker  . Smokeless tobacco: Never Used  Substance Use Topics  . Alcohol use: No  . Drug use: No   ROS See pertinent in HPI. All other systems non contributory Blood pressure 118/82, pulse 84, height 5\' 4"  (1.626 m), weight 239 lb (108.4 kg).  GENERAL: Well-developed, well-nourished female in no acute distress.  ABDOMEN: Soft, nontender, nondistended. No organomegaly. PELVIC: Normal external female genitalia. Vagina is pink and rugated.  Normal discharge. Normal appearing cervix.  EXTREMITIES: No cyanosis, clubbing, or edema, 2+ distal pulses.  A/P 34 yo with vaginitis - vaginal swab collected - Patient will be contacted with abnormal results - Discussed perineal care - Patient interested in weight loss management and optimizing her type 2 DM. Patient referred to nutritionist

## 2020-03-03 LAB — CERVICOVAGINAL ANCILLARY ONLY
Bacterial Vaginitis (gardnerella): POSITIVE — AB
Candida Glabrata: POSITIVE — AB
Candida Vaginitis: POSITIVE — AB
Comment: NEGATIVE
Comment: NEGATIVE
Comment: NEGATIVE

## 2020-03-03 MED ORDER — FLUCONAZOLE 150 MG PO TABS: 150.0000 mg | ORAL_TABLET | Freq: Once | ORAL | 3 refills | Status: AC

## 2020-03-03 MED ORDER — CLINDAMYCIN HCL 300 MG PO CAPS: 300.0000 mg | ORAL_CAPSULE | Freq: Two times a day (BID) | ORAL | 0 refills | Status: DC

## 2020-03-03 NOTE — Addendum Note (Signed)
Addended by: Catalina Antigua on: 03/03/2020 12:40 PM   Modules accepted: Orders

## 2020-03-05 ENCOUNTER — Other Ambulatory Visit: Payer: Self-pay

## 2020-03-05 ENCOUNTER — Encounter: Payer: Medicare Other | Attending: Obstetrics and Gynecology | Admitting: Skilled Nursing Facility1

## 2020-03-05 DIAGNOSIS — Z713 Dietary counseling and surveillance: Secondary | ICD-10-CM | POA: Insufficient documentation

## 2020-03-05 DIAGNOSIS — Z6841 Body Mass Index (BMI) 40.0 and over, adult: Secondary | ICD-10-CM | POA: Insufficient documentation

## 2020-03-05 DIAGNOSIS — E669 Obesity, unspecified: Secondary | ICD-10-CM

## 2020-03-05 NOTE — Progress Notes (Signed)
°  Assessment:  Primary concerns today: Gaining weight   Pt states she is eating late after work. Pt states her work includes 1-2 miles of walking. Pt states she will have appointments with counselor. Pt states normal  Weight for her would be 130 lbs.  Pt states she feels like she need to have taste in her food. Pt states this session was eye opening realizing portion size for juice, chocolate donuts and how much she was eating. Pt states she is getting less than 5hr a night of sleep without rest. Pt state she drinks about 32 ounces of juice and 1 box of Entemmens doughnuts per day.  Pt has a strong craving for carbohydrates/simple sugars/high fat: Dietitian educated pt on this possibly being attributed to lack of sleep, mental health medications and stress  A1C 6.1   Body Composition Scale Date  Current Body Weight 236.6  Total Body Fat % 43.6  Visceral Fat 13  Fat-Free Mass % 56.3   Total Body Water % 42.6  Muscle-Mass lbs 32  BMI 41.5  Body Fat Displacement          Torso  lbs 64         Left Leg  lbs 12.8         Right Leg  lbs 12.8         Left Arm  lbs 6.4         Right Arm   lbs 6.4      MEDICATIONS: see list   DIETARY INTAKE:  Usual eating pattern includes 3 meals and 3 snacks per day.  Everyday foods include doughnuts.  Avoided foods include none stated.    24-hr recall:  B (6:30 AM): 2 bowls Chobani smoothie or Cereals/chireo Snk (9:45 AM): granola bar L (1:15 PM): Frozen - swedish meal ball, Parmesan, soda Snk (4:30 PM): canned pear or pineapple D ( PM): wings, pizza Snk ( PM): entimanns donut Beverages: juice, soda, water  Usual physical activity: ADL  Estimated energy needs: 1600 calories  Progress Towards Goal(s):  In progress.     Nutritional Diagnosis:  NB-1.1 Food and nutrition-related knowledge deficit As related to no prior nutrition education from a professional.  As evidenced by 24 hr recall and questions asked.    Intervention:   Nutrition counseling. Dietitian educated pt on healthy eating within the context of weight management and diabetes control.  New goal: Reading nutrition facts Breakfast: chobani+2 spn peanut butter+ Apple OR Cherio+hanful of nuts OR Egg+cheese+tomatoo+spinach Snack: 1 cheese stick+crackers Lunch: Healthy choice power bowls Snacks: Carrot + humus Dinner: beans+cheese OR can of soup + romain lettuce OR salmon + brown rice+ broccoli  Teaching Method Utilized:  Visual Auditory Hands on  Handouts given during visit include:  Detailed MyPlate  Barriers to learning/adherence to lifestyle change: strong cravings   Demonstrated degree of understanding via:  Teach Back   Monitoring/Evaluation:  Dietary intake, exercise, and body weight prn.

## 2020-04-06 ENCOUNTER — Encounter: Payer: Medicare Other | Attending: Obstetrics and Gynecology | Admitting: Skilled Nursing Facility1

## 2020-04-06 ENCOUNTER — Other Ambulatory Visit: Payer: Self-pay

## 2020-04-06 DIAGNOSIS — Z713 Dietary counseling and surveillance: Secondary | ICD-10-CM | POA: Diagnosis not present

## 2020-04-06 DIAGNOSIS — E669 Obesity, unspecified: Secondary | ICD-10-CM

## 2020-04-06 DIAGNOSIS — Z6841 Body Mass Index (BMI) 40.0 and over, adult: Secondary | ICD-10-CM | POA: Diagnosis not present

## 2020-04-06 NOTE — Progress Notes (Signed)
  Assessment:  Primary concerns today: Gaining weight  Pt has a strong craving for carbohydrates/simple sugars/high fat: Dietitian educated pt on this possibly being attributed to lack of sleep, mental health medications and stress  A1C 6.1  Pt states this last month has been busy stating she has been doing really good with dietary changes until last night when she ate an entire box of doughnuts stating she was really emotional last night due to the holidays making her feel a certain way and worried about her carpal tunnel upcoming.  Pt states her eye doctor referred her to a neurologist due to some pressure behind her eyes. Pt states she is really anxious and nervous. Pt state she has been watering her juice down and increasing her water.   Pt is very motivated just struggling with emotional eating.    Body Composition Scale Date 04/06/2020  Current Body Weight 236.6 237.3  Total Body Fat % 43.6 43.5  Visceral Fat 13 13  Fat-Free Mass % 56.3 56.4   Total Body Water % 42.6 42.7  Muscle-Mass lbs 32 32.3  BMI 41.5 41.1  Body Fat Displacement           Torso  lbs 64 63.9         Left Leg  lbs 12.8 12.7         Right Leg  lbs 12.8 12.7         Left Arm  lbs 6.4 6.3         Right Arm   lbs 6.4 6.3    MEDICATIONS: see list   DIETARY INTAKE:  Usual eating pattern includes 3 meals and 3 snacks per day.  Everyday foods include doughnuts.  Avoided foods include none stated.    24-hr recall:  B (6:30 AM): chobani smoothie Snk (9:45 AM): granola bar L (1:15 PM): Frozen - swedish meal ball, Parmesan, soda Snk (4:30 PM): canned pear or pineapple D ( PM): wings, pizza Snk ( PM): entimanns donut Beverages: juice, soda, water  Usual physical activity: ADL  Estimated energy needs: 1600 calories  Progress Towards Goal(s):  In progress.  Nutritional Diagnosis:  NB-1.1 Food and nutrition-related knowledge deficit As related to no prior nutrition education from a professional.  As  evidenced by 24 hr recall and questions asked.    Intervention:  Nutrition counseling. Dietitian educated pt on healthy eating within the context of weight management and diabetes control.   Find a different stress relief mechanism such as going to th gym when you feel anxious; don't think about it being for controlling diabetes or weight literally only think about it for feeling better in that moment  Make sure you have non starchy vegetables with all of your meals: instead of worrying about what you should not be eating focus on ensuring you are eating mom starchy vegetables   Teaching Method Utilized:  Visual Auditory Hands on  Handouts given during visit include:  Detailed MyPlate  Barriers to learning/adherence to lifestyle change: strong cravings   Demonstrated degree of understanding via:  Teach Back   Monitoring/Evaluation:  Dietary intake, exercise, and body weight prn.

## 2020-04-28 ENCOUNTER — Telehealth: Payer: Self-pay

## 2020-04-28 NOTE — Telephone Encounter (Signed)
Received message from patient- she requesting medication for BV and yeast. LMTCB

## 2020-05-07 ENCOUNTER — Ambulatory Visit: Payer: Medicare Other | Admitting: Skilled Nursing Facility1

## 2020-05-14 ENCOUNTER — Ambulatory Visit: Payer: Medicare Other

## 2020-05-25 ENCOUNTER — Ambulatory Visit: Payer: Medicare Other | Admitting: Skilled Nursing Facility1

## 2020-05-28 ENCOUNTER — Encounter: Payer: Self-pay | Admitting: *Deleted

## 2020-05-29 ENCOUNTER — Other Ambulatory Visit: Payer: Self-pay

## 2020-05-29 ENCOUNTER — Ambulatory Visit (INDEPENDENT_AMBULATORY_CARE_PROVIDER_SITE_OTHER): Payer: Medicare Other | Admitting: Neurology

## 2020-05-29 ENCOUNTER — Encounter: Payer: Self-pay | Admitting: Neurology

## 2020-05-29 VITALS — BP 145/95 | HR 80 | Ht 64.0 in | Wt 246.0 lb

## 2020-05-29 DIAGNOSIS — H4710 Unspecified papilledema: Secondary | ICD-10-CM

## 2020-05-29 DIAGNOSIS — E538 Deficiency of other specified B group vitamins: Secondary | ICD-10-CM | POA: Diagnosis not present

## 2020-05-29 DIAGNOSIS — G4489 Other headache syndrome: Secondary | ICD-10-CM | POA: Diagnosis not present

## 2020-05-29 DIAGNOSIS — Z5181 Encounter for therapeutic drug level monitoring: Secondary | ICD-10-CM

## 2020-05-29 HISTORY — DX: Unspecified papilledema: H47.10

## 2020-05-29 NOTE — Progress Notes (Signed)
Reason for visit: Headache, papilledema  Referring physician: Dr. Neva Seat [name] is a 35 y.o. female  History of present illness:  April [name] is a 35 year old black female with a history of schizoaffective disorder and a history of migraine headaches since her teenage years.  The patient has been followed through her optometrist for a number of years, he has noted some mild disc blurring since 2017, but he believes that this has significantly worsened on the last evaluation.  There is some concern for papilledema and the patient is referred here for further evaluation.  Over the last 6 months, the patient's headaches have become more frequent, she is now having headaches about every other day.  The headaches are retro-orbital and temporal in nature and may be associated with photophobia.  The patient denies any tinnitus or muffled hearing.  She denies any neck stiffness.  She does have spots in front of the eyes off and on and she may have occasional episodes of double vision.  She has a history of diabetes, she recently had surgery on the left wrist for carpal tunnel syndrome.  She denies any significant balance problems or difficulty with weakness of the extremities.  She does have urinary frequency but no incontinence.  She believes that she has gained about 11 pounds over the last month.  She has a first cousin on the mother side who has headache.      Past Medical History:  Diagnosis Date  . Carpal tunnel syndrome on left   . Diabetes mellitus without complication (HCC) 05/2012  . Hypertension   . Insomnia   . Schizo affective schizophrenia [facility])     Past Surgical History:  Procedure Laterality Date  . CARPAL TUNNEL RELEASE Left     Family History  Problem Relation Age of Onset  . Diabetes Maternal Aunt   . Glaucoma Maternal Aunt   . Diabetes Maternal Aunt     Social history:  reports that she has never smoked. She has never used smokeless tobacco. She reports  that she does not drink alcohol and does not use drugs.  Medications:  Prior to Admission medications   Medication Sig Start Date End Date Taking? Authorizing Provider  clindamycin (CLEOCIN) 300 MG capsule Take 1 capsule (300 mg total) by mouth 2 (two) times daily. For seven days 03/03/20  Yes Constant, Peggy, MD  clonazePAM (KLONOPIN) 0.5 MG tablet Take 0.5 mg by mouth 2 (two) times daily as needed for anxiety.   Yes [provider]  cloZAPine (CLOZARIL) 25 MG tablet Take 25 mg by mouth daily. Take 1 1/2 tablet PO every hs   Yes [provider]  fluPHENAZine (PROLIXIN) 5 MG tablet Take 2.5 mg by mouth daily.   Yes [provider]  lisinopril (PRINIVIL,ZESTRIL) 2.5 MG tablet Take 2.5 mg by mouth daily.   Yes [provider]  sitaGLIPtin (JANUVIA) 50 MG tablet Take 50 mg by mouth daily.   Yes [provider]      Allergies  Allergen Reactions  . Latex Dermatitis  . Metformin Nausea Only  . Olanzapine Diarrhea and Nausea Only  . Other     pollen    ROS:  Out of a complete 14 system review of symptoms, the patient complains only of the following symptoms, and all other reviewed systems are negative.  Headache Episodic double vision Dizziness  Blood pressure (!) 145/95, pulse 80, height 5\' 4"  (1.626 m), weight 246 lb (111.6 kg).  Physical Exam  General: The patient is alert and cooperative at the time of the examination.  The patient is markedly obese.  Eyes: Pupils are equal, round, and reactive to light. Discs are somewhat blurred bilaterally, no hemorrhages are seen.  Neck: The neck is supple, no carotid bruits are noted.  Respiratory: The respiratory examination is clear.  Cardiovascular: The cardiovascular examination reveals a regular rate and rhythm, no obvious murmurs or rubs are noted.  Skin: Extremities are without significant edema.  Neurologic Exam  Mental status: The patient is alert and oriented x 3 at the time of  the examination. The patient has apparent normal recent and remote memory, with an apparently normal attention span and concentration ability.  Cranial nerves: Facial symmetry is present. There is good sensation of the face to pinprick and soft touch bilaterally. The strength of the facial muscles and the muscles to head turning and shoulder shrug are normal bilaterally. Speech is well enunciated, no aphasia or dysarthria is noted. Extraocular movements are full. Visual fields are full. The tongue is midline, and the patient has symmetric elevation of the soft palate. No obvious hearing deficits are noted.  Motor: The motor testing reveals 5 over 5 strength of all 4 extremities. Good symmetric motor tone is noted throughout.  Sensory: Sensory testing is intact to pinprick, soft touch and vibration sensation on all 4 extremities.  Position sense is decreased in all 4 extremities, more prominent in the hands than in the feet.  No evidence of extinction is noted.  Coordination: Cerebellar testing reveals good finger-nose-finger and heel-to-shin bilaterally.  Gait and station: Gait is normal. Tandem gait is unsteady. Romberg is negative. No drift is seen.  Reflexes: Deep tendon reflexes are symmetric, but are depressed bilaterally. Toes are downgoing bilaterally.   Assessment/Plan:  1.  Papilledema  2.  Headache, probable migraine  3.  Schizoaffective disorder  4.  Diabetes  The patient will be sent for blood work today.  She will have MRI of the brain.  If the MRI of the brain is unremarkable, we will set her up for lumbar puncture.  We will then initiate medication therapy for her headache.  She will follow up here in 4 months.  Marlan Palau MD 05/29/2020 9:14 AM  [facility] [address] [place], [place] [address]  Phone [phone] Fax [phone]

## 2020-05-30 LAB — COMPREHENSIVE METABOLIC PANEL: GFR calc non Af Amer: 88 mL/min/{1.73_m2} (ref >59)

## 2020-05-30 LAB — CBC WITH DIFFERENTIAL/PLATELET: WBC: 7.5 10*3/uL (ref 3.4–10.8)

## 2020-05-30 LAB — COPPER, SERUM

## 2020-05-31 LAB — VITAMIN B12: Vitamin B-12: 525 pg/mL (ref 232–1245)

## 2020-05-31 LAB — CBC WITH DIFFERENTIAL/PLATELET
Basophils Absolute: 0 10*3/uL (ref 0.0–0.2)
Basos: 0 %
EOS (ABSOLUTE): 0.2 10*3/uL (ref 0.0–0.4)
Eos: 2 %
Hematocrit: 39.9 % (ref 34.0–46.6)
Hemoglobin: 13 g/dL (ref 11.1–15.9)
Immature Grans (Abs): 0 10*3/uL (ref 0.0–0.1)
Immature Granulocytes: 0 %
Lymphocytes Absolute: 3 10*3/uL (ref 0.7–3.1)
Lymphs: 40 %
MCH: 27.1 pg (ref 26.6–33.0)
MCHC: 32.6 g/dL (ref 31.5–35.7)
MCV: 83 fL (ref 79–97)
Monocytes Absolute: 0.6 10*3/uL (ref 0.1–0.9)
Monocytes: 8 %
Neutrophils Absolute: 3.8 10*3/uL (ref 1.4–7.0)
Neutrophils: 50 %
Platelets: 399 10*3/uL (ref 150–450)
RBC: 4.8 x10E6/uL (ref 3.77–5.28)
RDW: 13.2 % (ref 11.7–15.4)

## 2020-05-31 LAB — COMPREHENSIVE METABOLIC PANEL
ALT: 30 IU/L (ref 0–32)
AST: 20 IU/L (ref 0–40)
Albumin/Globulin Ratio: 1.9 (ref 1.2–2.2)
Albumin: 4 g/dL (ref 3.8–4.8)
Alkaline Phosphatase: 93 IU/L (ref 44–121)
BUN/Creatinine Ratio: 8 — ABNORMAL LOW (ref 9–23)
BUN: 7 mg/dL (ref 6–20)
Bilirubin Total: 0.2 mg/dL (ref 0.0–1.2)
CO2: 21 mmol/L (ref 20–29)
Calcium: 8.7 mg/dL (ref 8.7–10.2)
Chloride: 106 mmol/L (ref 96–106)
Creatinine, Ser: 0.86 mg/dL (ref 0.57–1.00)
GFR calc Af Amer: 101 mL/min/{1.73_m2} (ref >59)
Globulin, Total: 2.1 g/dL (ref 1.5–4.5)
Glucose: 124 mg/dL — ABNORMAL HIGH (ref 65–99)
Potassium: 4.1 mmol/L (ref 3.5–5.2)
Sodium: 140 mmol/L (ref 134–144)
Total Protein: 6.1 g/dL (ref 6.0–8.5)

## 2020-05-31 LAB — SEDIMENTATION RATE: Sed Rate: 9 mm/hr (ref 0–32)

## 2020-06-01 ENCOUNTER — Telehealth: Payer: Self-pay | Admitting: Neurology

## 2020-06-01 ENCOUNTER — Encounter: Payer: Medicare Other | Admitting: Skilled Nursing Facility1

## 2020-06-01 NOTE — Telephone Encounter (Signed)
medicare/medicaid order sent to [facility]. No auth they will reach out to the patient to schedule.  °

## 2020-06-02 ENCOUNTER — Other Ambulatory Visit: Payer: Self-pay

## 2020-06-02 ENCOUNTER — Telehealth: Payer: Self-pay | Admitting: Emergency Medicine

## 2020-06-02 ENCOUNTER — Encounter: Payer: Medicare Other | Attending: Obstetrics and Gynecology | Admitting: Skilled Nursing Facility1

## 2020-06-02 DIAGNOSIS — E119 Type 2 diabetes mellitus without complications: Secondary | ICD-10-CM | POA: Insufficient documentation

## 2020-06-02 DIAGNOSIS — E669 Obesity, unspecified: Secondary | ICD-10-CM | POA: Diagnosis present

## 2020-06-02 NOTE — Telephone Encounter (Signed)
-----   Message from York Spaniel, MD sent at 06/01/2020  3:42 PM EST -----  The blood work results are unremarkable. Please call the patient.  ----- Message ----- From: Nell Range Lab Results In Sent: 05/30/2020   7:37 AM EST To: York Spaniel, MD

## 2020-06-02 NOTE — Progress Notes (Signed)
  Assessment:  Primary concerns today: Gaining weight  Pt has a strong craving for carbohydrates/simple sugars/high fat: Dietitian educated pt on this possibly being attributed to lack of sleep, mental health medications and stress  Pt is very motivated just struggling with emotional eating.   Pt states her diabetes medication were adjusted having being taken off invokana. Pt state she got carpal tunnel surgery (1 year ago) and with the holidays states it was hard to make dietary changes. Pt sates th esurgery was rally painful and was stressed due to her A1C having increased.  Pt stets her A1C is 6.6. Pt state she will start taking her blood sugar 3 times a day.  Pt states she is doing more water and doing almond milk and eating more greens and doing more keto/gluten free.   Goals: Try to make a structured meal pattern if eat outside of that it is not hunger Wants a membership to gym which would help with cravings   Body Composition Scale Date 04/06/2020  Current Body Weight 236.6 237.3  Total Body Fat % 43.6 43.5  Visceral Fat 13 13  Fat-Free Mass % 56.3 56.4   Total Body Water % 42.6 42.7  Muscle-Mass lbs 32 32.3  BMI 41.5 41.1  Body Fat Displacement           Torso  lbs 64 63.9         Left Leg  lbs 12.8 12.7         Right Leg  lbs 12.8 12.7         Left Arm  lbs 6.4 6.3         Right Arm   lbs 6.4 6.3    MEDICATIONS: see list   DIETARY INTAKE:  Usual eating pattern includes 3 meals and 3 snacks per day.  Everyday foods include doughnuts.  Avoided foods include none stated.    24-hr recall:  B (6:30 AM): chobani smoothie or 2 bottles of water + cheerios +1% milk Snk (9:45 AM): granola bar L (1:15 PM): Frozen - swedish meal ball, Parmesan, soda or gluten free popcorn Snk (4:30 PM): canned pear or pineapple D ( PM): wings, pizza or spring mix + chicken + brown rice + honey mustard Snk ( 10-11PM): 2 cheese sticks + crackers Beverages: juice, soda, water  Usual physical  activity: ADL  Estimated energy needs: 1600 calories  Progress Towards Goal(s):  In progress.     Intervention:  Nutrition counseling. Dietitian educated pt on healthy eating within the context of weight management and diabetes control.   Continued:  Find a different stress relief mechanism such as going to th gym when you feel anxious; don't think about it being for controlling diabetes or weight literally only think about it for feeling better in that moment  Make sure you have non starchy vegetables with all of your meals: instead of worrying about what you should not be eating focus on ensuring you are eating mom starchy vegetables   Teaching Method Utilized:  Visual Auditory Hands on  Handouts previously given during visit include:  Detailed MyPlate  Barriers to learning/adherence to lifestyle change: strong cravings   Demonstrated degree of understanding via:  Teach Back   Monitoring/Evaluation:  Dietary intake, exercise, and body weight prn.

## 2020-06-02 NOTE — Telephone Encounter (Signed)
Called and left message for patient on VM (on DPR) with Dr. Clarisa Kindred review of her blood work. Office number left to return call if she has any quesitons.

## 2020-06-14 ENCOUNTER — Telehealth: Payer: Self-pay | Admitting: Neurology

## 2020-06-14 ENCOUNTER — Ambulatory Visit
Admission: RE | Admit: 2020-06-14 | Discharge: 2020-06-14 | Disposition: A | Discharge status: Home / Self Care | Payer: Medicare Other | Source: Ambulatory Visit | Attending: Neurology | Admitting: Neurology

## 2020-06-14 ENCOUNTER — Other Ambulatory Visit: Payer: Self-pay

## 2020-06-14 DIAGNOSIS — H4710 Unspecified papilledema: Secondary | ICD-10-CM

## 2020-06-14 DIAGNOSIS — G4489 Other headache syndrome: Secondary | ICD-10-CM | POA: Diagnosis not present

## 2020-06-14 MED ORDER — GADOBENATE DIMEGLUMINE 529 MG/ML IV SOLN
20.0000 mL | Freq: Once | INTRAVENOUS | Status: AC | PRN
Start: 2020-06-14 — End: 2020-06-14
  Administered 2020-06-14: 20 mL via INTRAVENOUS

## 2020-06-14 NOTE — Telephone Encounter (Signed)
I called the patient.  The MRI of the brain is relatively unremarkable, I will go ahead and get the spinal tap set up for the patient to look for pseudotumor cerebri.   MRI brain 06/14/20:  IMPRESSION: This MRI of the brain with and without contrast shows the following: 1.   A few punctate T2/FLAIR hyperintense foci in the subcortical and deep white matter.  This is a nonspecific finding and could be due to chronic sequela of migraine headache or age advanced chronic microvascular ischemic change.  None of the foci enhance or appear to be acute. 2.   The pituitary gland is thinned within an enlarged sella turcica.  This could be incidental or due to elevated intracranial pressure. 3.   No acute findings.  Normal enhancement pattern.

## 2020-06-24 ENCOUNTER — Emergency Department (HOSPITAL_COMMUNITY)
Admission: EM | Admit: 2020-06-24 | Discharge: 2020-06-24 | Disposition: A | Discharge status: Home / Self Care | Payer: Medicare Other | Attending: Emergency Medicine | Admitting: Emergency Medicine

## 2020-06-24 ENCOUNTER — Telehealth: Payer: Self-pay | Admitting: Neurology

## 2020-06-24 ENCOUNTER — Ambulatory Visit
Admission: RE | Admit: 2020-06-24 | Discharge: 2020-06-24 | Disposition: A | Discharge status: Home / Self Care | Payer: Medicare Other | Source: Ambulatory Visit | Attending: Neurology | Admitting: Neurology

## 2020-06-24 ENCOUNTER — Telehealth: Payer: Self-pay | Admitting: Emergency Medicine

## 2020-06-24 ENCOUNTER — Emergency Department (HOSPITAL_COMMUNITY): Payer: Medicare Other

## 2020-06-24 ENCOUNTER — Other Ambulatory Visit: Payer: Self-pay

## 2020-06-24 VITALS — BP 155/103 | HR 88 | Resp 20

## 2020-06-24 DIAGNOSIS — Z7984 Long term (current) use of oral hypoglycemic drugs: Secondary | ICD-10-CM | POA: Insufficient documentation

## 2020-06-24 DIAGNOSIS — H538 Other visual disturbances: Secondary | ICD-10-CM | POA: Insufficient documentation

## 2020-06-24 DIAGNOSIS — R55 Syncope and collapse: Secondary | ICD-10-CM | POA: Insufficient documentation

## 2020-06-24 DIAGNOSIS — Z91040 Latex allergy status: Secondary | ICD-10-CM | POA: Diagnosis not present

## 2020-06-24 DIAGNOSIS — E119 Type 2 diabetes mellitus without complications: Secondary | ICD-10-CM | POA: Diagnosis not present

## 2020-06-24 DIAGNOSIS — N911 Secondary amenorrhea: Secondary | ICD-10-CM

## 2020-06-24 DIAGNOSIS — H4710 Unspecified papilledema: Secondary | ICD-10-CM

## 2020-06-24 DIAGNOSIS — J019 Acute sinusitis, unspecified: Secondary | ICD-10-CM | POA: Diagnosis not present

## 2020-06-24 DIAGNOSIS — Z79899 Other long term (current) drug therapy: Secondary | ICD-10-CM | POA: Diagnosis not present

## 2020-06-24 DIAGNOSIS — I10 Essential (primary) hypertension: Secondary | ICD-10-CM | POA: Diagnosis not present

## 2020-06-24 DIAGNOSIS — R519 Headache, unspecified: Secondary | ICD-10-CM | POA: Diagnosis present

## 2020-06-24 LAB — CBC WITH DIFFERENTIAL/PLATELET
Abs Immature Granulocytes: 0.01 10*3/uL (ref 0.00–0.07)
Basophils Absolute: 0 10*3/uL (ref 0.0–0.1)
Basophils Relative: 0 %
Eosinophils Absolute: 0.2 10*3/uL (ref 0.0–0.5)
Eosinophils Relative: 4 %
HCT: 40.8 % (ref 36.0–46.0)
Hemoglobin: 12.9 g/dL (ref 12.0–15.0)
Immature Granulocytes: 0 %
Lymphocytes Relative: 38 %
Lymphs Abs: 2.2 10*3/uL (ref 0.7–4.0)
MCH: 26.9 pg (ref 26.0–34.0)
MCHC: 31.6 g/dL (ref 30.0–36.0)
MCV: 85 fL (ref 80.0–100.0)
Monocytes Absolute: 0.5 10*3/uL (ref 0.1–1.0)
Monocytes Relative: 9 %
Neutro Abs: 2.8 10*3/uL (ref 1.7–7.7)
Neutrophils Relative %: 49 %
Platelets: 409 10*3/uL — ABNORMAL HIGH (ref 150–400)
RBC: 4.8 MIL/uL (ref 3.87–5.11)
RDW: 14.3 % (ref 11.5–15.5)
WBC: 5.7 10*3/uL (ref 4.0–10.5)
nRBC: 0 % (ref 0.0–0.2)

## 2020-06-24 LAB — COMPREHENSIVE METABOLIC PANEL
ALT: 31 U/L (ref 0–44)
AST: 27 U/L (ref 15–41)
Albumin: 3.5 g/dL (ref 3.5–5.0)
Alkaline Phosphatase: 85 U/L (ref 38–126)
Anion gap: 9 (ref 5–15)
BUN: 7 mg/dL (ref 6–20)
CO2: 21 mmol/L — ABNORMAL LOW (ref 22–32)
Calcium: 8.9 mg/dL (ref 8.9–10.3)
Chloride: 107 mmol/L (ref 98–111)
Creatinine, Ser: 0.82 mg/dL (ref 0.44–1.00)
GFR, Estimated: >60 mL/min (ref >60)
Glucose, Bld: 122 mg/dL — ABNORMAL HIGH (ref 70–99)
Potassium: 3.9 mmol/L (ref 3.5–5.1)
Sodium: 137 mmol/L (ref 135–145)
Total Bilirubin: 0.5 mg/dL (ref 0.3–1.2)
Total Protein: 6.6 g/dL (ref 6.5–8.1)

## 2020-06-24 LAB — CSF CELL COUNT WITH DIFFERENTIAL
RBC Count, CSF: 0 cells/uL
WBC, CSF: 0 cells/uL (ref 0–5)

## 2020-06-24 LAB — CBG MONITORING, ED: Glucose-Capillary: 121 mg/dL — ABNORMAL HIGH (ref 70–99)

## 2020-06-24 LAB — I-STAT BETA HCG BLOOD, ED (MC, WL, AP ONLY): I-stat hCG, quantitative: <5 m[IU]/mL (ref <5)

## 2020-06-24 LAB — PROTEIN, CSF: Total Protein, CSF: 20 mg/dL (ref 15–45)

## 2020-06-24 LAB — GLUCOSE, CSF: Glucose, CSF: 96 mg/dL — ABNORMAL HIGH (ref 40–80)

## 2020-06-24 MED ORDER — RIZATRIPTAN BENZOATE 10 MG PO TBDP: ORAL_TABLET | ORAL | 3 refills | Status: DC

## 2020-06-24 MED ORDER — AMOXICILLIN-POT CLAVULANATE 875-125 MG PO TABS: 1.0000 | ORAL_TABLET | Freq: Two times a day (BID) | ORAL | 0 refills | Status: DC

## 2020-06-24 MED ORDER — SODIUM CHLORIDE 0.9 % IV BOLUS
1000.0000 mL | Freq: Once | INTRAVENOUS | Status: AC
  Administered 2020-06-24: 1000 mL via INTRAVENOUS

## 2020-06-24 MED ORDER — METOCLOPRAMIDE HCL 5 MG/ML IJ SOLN
10.0000 mg | Freq: Once | INTRAMUSCULAR | Status: AC
  Administered 2020-06-24: 10 mg via INTRAVENOUS
  Filled 2020-06-24: qty 2

## 2020-06-24 MED ORDER — TOPIRAMATE 25 MG PO TABS: ORAL_TABLET | ORAL | 3 refills | Status: DC

## 2020-06-24 NOTE — Telephone Encounter (Signed)
GI has called Patient x 2 to schedule Lp left Message . I have called Patient this morning asking her to please call the office office back .  Calling Solomon at GI to see if we can get her in today .  Dr. Anne Hahn aware .

## 2020-06-24 NOTE — ED Notes (Addendum)
Pt toileted.  

## 2020-06-24 NOTE — Telephone Encounter (Signed)
Late entry: Patient's mother, Lurena Joiner, called after-hours call center at 5:14 PM on 06/24/2020 regarding patient's vision.  I was able to talk to patient's mother in the evening, she reported that patient had woken up with a sudden decline in her vision.  She also had some eye pain.  She reports that they were supposed to hear about a spinal tap.  She reports that they have been waiting for 3 weeks to hear about the spinal tap appointment.  She was advised that I could not set up a spinal tap electively as an outpatient after hours but that I would send a message to the patient's primary neurologist, Dr. Anne Hahn.  Since patient experienced a sudden decline in vision since the morning, I advised patient's mom to take her to the emergency room to get her evaluated on an urgent basis.  Patient's mother demonstrated understanding and agreement.  Upon chart review this morning, there are no ER records from yesterday in the [facility].  An outpatient LP was ordered on 06/14/2020.  Dr. Anne Hahn, please review.

## 2020-06-24 NOTE — Progress Notes (Signed)
Notified Alcario Drought, RN at Dr. Anne Hahn office regarding pt and events surrounding LP.

## 2020-06-24 NOTE — Telephone Encounter (Signed)
I called the patient and left a message, I will see if I can get the lumbar puncture done urgently today, if not she may have to go to the emergency room for lumbar puncture.  I am not sure why the procedure has taken so long to get on schedule.

## 2020-06-24 NOTE — Telephone Encounter (Signed)
Spoke to patient and Olegario Messier on conference call she is going today at 10:15 am to GI for LP her mother will take her .

## 2020-06-24 NOTE — Telephone Encounter (Signed)
Received call from [place], RN @ [facility].  Following LP, patient was found barely responsive, eyes were flickering and dysphagic.  Patient remained hypertensive, sent to ED for evaluation.

## 2020-06-24 NOTE — Progress Notes (Signed)
Pt was here receiving an LP procedure by Dr. Deanne Coffer. Nursing and MD was called into the procedure room after the procedure was complete due to pt "fainting". Pt was found on the ground, pt was responsive at my time of arrival. Per radiology tech, she did not hit anything. Pt was slid down to ground. Vital sign machine was attached. See flowsheets. Pt denied any pain but had some aphasia. Dr. Deanne Coffer was in the room and completed a neuro exam. Due to the aphasia, EMS was called. Pts mom, Lurena Joiner, who was waiting in the car, was notified and was brought into the building to be with her and to speak with Dr. Deanne Coffer regarding the situation. EMS arrived and pt was taken to the ED.

## 2020-06-24 NOTE — Discharge Instructions (Addendum)
Follow-up with your neurologist regarding your fluid analysis. Continue home medications as previously prescribed. Take the antibiotics as directed for your sinus infection. Return to the ER if you start to experience worsening headache, blurry vision, chest pain, neck stiffness.

## 2020-06-24 NOTE — Telephone Encounter (Signed)
Dr. Anne Hahn Patient's mother is the only one that can take her today she is going to have to leave work . Patient mothers works at [employer] is there any one you can provide her mother a work note for today because she is having to leave work to take her daughter ?   Her mother's mother name is Alyana Kreiter

## 2020-06-24 NOTE — Addendum Note (Signed)
Addended by: York Spaniel on: 06/24/2020 05:26 PM   Modules accepted: Orders

## 2020-06-24 NOTE — ED Triage Notes (Addendum)
PT BIB EMS from [facility] for c/o dizziness and HA. Pt had a syncopal episode after sitting up from an LP. When patient return to consciousness, the pt had some aphasia, slurred speech, and HTN. Pt A/Ox4 on arrival; no slurred speech at this time. C-collar in place. Pt reports LP was done for vision issues.

## 2020-06-24 NOTE — Discharge Instructions (Signed)

## 2020-06-24 NOTE — ED Provider Notes (Signed)
[facility] EMERGENCY DEPARTMENT Provider Note   CSN: [id number] Arrival date & time: 06/24/20  1058     History Chief Complaint  Patient presents with  . Near Syncope  . Dizziness    April [name] is a 35 y.o. female with a past medical history of diabetes, hypertension, chronic headaches, presenting to the ED with a chief complaint of syncope.  Was at radiologist office this morning when she underwent an IR guided LP.  She had a syncopal episode when the procedure was completed.  Chart review shows that patient became aphasic, hypertensive and then syncopized. Patient states that she had a headache this morning when she went to have a procedure done but this worsened after the LP was done.  Also reports persistent blurry vision for the past several weeks. She takes Excedrin for her migraines intermittently.  No new medication changes or adjustments. Denies any numbness in arms or legs, vomiting, diarrhea, fever, neck stiffness, chest pain or shortness of breath.  HPI     Past Medical History:  Diagnosis Date  . Carpal tunnel syndrome on left   . Diabetes mellitus without complication (HCC) 05/2012  . Headache   . Hypertension   . Insomnia   . 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 Schizo affective schizophrenia [facility])     Patient Active Problem List   Diagnosis Date Noted  . 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 Secondary amenorrhea 09/24/2012    Past Surgical History:  Procedure Laterality Date  . BREAST LUMPECTOMY Left   . CARPAL TUNNEL RELEASE Left   . WISDOM TOOTH EXTRACTION       OB History    Gravida  1   Para  1   Term  0   Preterm  1   AB  0   Living  1     SAB  0   IAB  0   Ectopic  0   Multiple  0   Live Births              Family History  Problem Relation Age of Onset  . Diabetes Maternal Aunt   . Glaucoma Maternal Aunt   . Diabetes Maternal Aunt   . Heart attack Father   . Obesity Father   . Diabetes Father   . Schizophrenia  Brother     Social History   Tobacco Use  . Smoking status: Never Smoker  . Smokeless tobacco: Never Used  Substance Use Topics  . Alcohol use: No  . Drug use: No    Home Medications Prior to Admission medications   Medication Sig Start Date End Date Taking? Authorizing Provider  amoxicillin-clavulanate (AUGMENTIN) 875-125 MG tablet Take 1 tablet by mouth every 12 (twelve) hours. 06/24/20  Yes Heavenlee Maiorana, PA-C  clonazePAM (KLONOPIN) 0.5 MG tablet Take 0.5 mg by mouth 2 (two) times daily as needed for anxiety.    [provider]  cloZAPine (CLOZARIL) 25 MG tablet Take 25 mg by mouth daily. Take 1 1/2 tablet PO every hs    [provider]  fluPHENAZine (PROLIXIN) 5 MG tablet Take 2.5 mg by mouth daily.    [provider]  lisinopril (PRINIVIL,ZESTRIL) 2.5 MG tablet Take 2.5 mg by mouth daily.    [provider]  sitaGLIPtin (JANUVIA) 50 MG tablet Take 50 mg by mouth daily.    [provider]    Allergies    Latex, Metformin, Olanzapine, and Other  Review of Systems   Review of Systems  Constitutional: Negative for appetite change, chills and fever.  HENT: Negative for ear pain, rhinorrhea, sneezing and sore throat.   Eyes: Positive for visual disturbance. Negative for photophobia.  Respiratory: Negative for cough, chest tightness, shortness of breath and wheezing.   Cardiovascular: Negative for chest pain and palpitations.  Gastrointestinal: Negative for abdominal pain, blood in stool, constipation, diarrhea, nausea and vomiting.  Genitourinary: Negative for dysuria, hematuria and urgency.  Musculoskeletal: Negative for myalgias.  Skin: Negative for rash.  Neurological: Positive for syncope and headaches. Negative for dizziness, weakness and light-headedness.    Physical Exam Updated Vital Signs BP 133/78   Pulse 69   Temp 97.9 F (36.6 C) (Oral)   Resp 15   SpO2 99%   Physical Exam Vitals and nursing note reviewed.   Constitutional:      General: She is not in acute distress.    Appearance: She is well-developed and well-nourished.     Comments: C-collar in place.  HENT:     Head: Normocephalic and atraumatic.     Nose: Nose normal.  Eyes:     General: No scleral icterus.       Right eye: No discharge.        Left eye: No discharge.     Extraocular Movements: EOM normal.     Conjunctiva/sclera: Conjunctivae normal.  Cardiovascular:     Rate and Rhythm: Normal rate and regular rhythm.     Pulses: Intact distal pulses.     Heart sounds: Normal heart sounds. No murmur heard. No friction rub. No gallop.   Pulmonary:     Effort: Pulmonary effort is normal. No respiratory distress.     Breath sounds: Normal breath sounds.  Abdominal:     General: Bowel sounds are normal. There is no distension.     Palpations: Abdomen is soft.     Tenderness: There is no abdominal tenderness. There is no guarding.  Musculoskeletal:        General: No edema. Normal range of motion.     Cervical back: Normal range of motion and neck supple.  Skin:    General: Skin is warm and dry.     Findings: No rash.  Neurological:     Mental Status: She is alert and oriented to person, place, and time.     Cranial Nerves: No cranial nerve deficit.     Sensory: No sensory deficit.     Motor: No weakness or abnormal muscle tone.     Coordination: Coordination normal.     Comments: Alert, oriented to baseline. No facial asymmetry. Strength 5/5 in BUE, BLE. Normal sensation to light touch of BUE, BLE, face.  Psychiatric:        Mood and Affect: Mood and affect normal.     ED Results / Procedures / Treatments   Labs (all labs ordered are listed, but only abnormal results are displayed) Labs Reviewed  COMPREHENSIVE METABOLIC PANEL - Abnormal; Notable for the following components:      Result Value   CO2 21 (*)    Glucose, Bld 122 (*)    All other components within normal limits  CBC WITH DIFFERENTIAL/PLATELET -  Abnormal; Notable for the following components:   Platelets 409 (*)    All other components within normal limits  CBG MONITORING, ED - Abnormal; Notable for the following components:   Glucose-Capillary 121 (*)    All other components within normal limits  I-STAT BETA HCG BLOOD, ED ([facility], [facility], [facility] ONLY)  EKG EKG Interpretation  Date/Time:  Wednesday June 24 2020 11:09:08 EST Ventricular Rate:  69 PR Interval:  154 QRS Duration: 76 QT Interval:  420 QTC Calculation: 450 R Axis:   12 Text Interpretation: Normal sinus rhythm Normal ECG No prior ECG for comparison. No STEMI Confirmed by Theda Belfast ([id number]) on 06/24/2020 11:13:22 AM   Radiology CT Head Wo Contrast  Result Date: 06/24/2020 CLINICAL DATA:  Syncope, simple, abnormal neuro exam. Additional history provided: Patient reports dizziness and headache, syncopal episode after sitting up from an LP. Aphasia and slurred speech and hypertension. EXAM: CT HEAD WITHOUT CONTRAST CT CERVICAL SPINE WITHOUT CONTRAST TECHNIQUE: Multidetector CT imaging of the head and cervical spine was performed following the standard protocol without intravenous contrast. Multiplanar CT image reconstructions of the cervical spine were also generated. COMPARISON:  Brain MRI 06/14/2020. FINDINGS: CT HEAD FINDINGS Brain: Cerebral volume is normal. There is no acute intracranial hemorrhage. No demarcated cortical infarct. No extra-axial fluid collection. No evidence of intracranial mass. No midline shift. Partially empty sella turcica. Vascular: No hyperdense vessel. Skull: Normal. Negative for fracture or focal lesion. Sinuses/Orbits: Visualized orbits show no acute finding. Mild mucosal thickening within the left frontal and bilateral ethmoid sinuses. Trace bilateral maxillary sinus air-fluid levels. CT CERVICAL SPINE FINDINGS Alignment: Nonspecific reversal of the expected cervical lordosis. No significant spondylolisthesis. Skull base and vertebrae: The  basion-dental and atlanto-dental intervals are maintained.No evidence of acute fracture to the cervical spine. Soft tissues and spinal canal: No prevertebral fluid or swelling. No visible canal hematoma. Disc levels: No significant bony spinal canal or neural foraminal narrowing at any level. Upper chest: No consolidation within the imaged lung apices. No visible pneumothorax. IMPRESSION: CT head: 1. No evidence of acute intracranial abnormality. 2. Partially empty sella turcica. This finding is very commonly incidental, but can be associated with idiopathic intracranial hypertension. 3. Mild paranasal sinus disease at the imaged levels. Correlate for acute sinusitis. CT cervical spine: 1. No evidence of acute fracture to the cervical spine. 2. Nonspecific reversal of the expected cervical lordosis. Electronically Signed   By: Jackey Loge DO   On: 06/24/2020 12:09   CT Cervical Spine Wo Contrast  Result Date: 06/24/2020 CLINICAL DATA:  Syncope, simple, abnormal neuro exam. Additional history provided: Patient reports dizziness and headache, syncopal episode after sitting up from an LP. Aphasia and slurred speech and hypertension. EXAM: CT HEAD WITHOUT CONTRAST CT CERVICAL SPINE WITHOUT CONTRAST TECHNIQUE: Multidetector CT imaging of the head and cervical spine was performed following the standard protocol without intravenous contrast. Multiplanar CT image reconstructions of the cervical spine were also generated. COMPARISON:  Brain MRI 06/14/2020. FINDINGS: CT HEAD FINDINGS Brain: Cerebral volume is normal. There is no acute intracranial hemorrhage. No demarcated cortical infarct. No extra-axial fluid collection. No evidence of intracranial mass. No midline shift. Partially empty sella turcica. Vascular: No hyperdense vessel. Skull: Normal. Negative for fracture or focal lesion. Sinuses/Orbits: Visualized orbits show no acute finding. Mild mucosal thickening within the left frontal and bilateral ethmoid sinuses.  Trace bilateral maxillary sinus air-fluid levels. CT CERVICAL SPINE FINDINGS Alignment: Nonspecific reversal of the expected cervical lordosis. No significant spondylolisthesis. Skull base and vertebrae: The basion-dental and atlanto-dental intervals are maintained.No evidence of acute fracture to the cervical spine. Soft tissues and spinal canal: No prevertebral fluid or swelling. No visible canal hematoma. Disc levels: No significant bony spinal canal or neural foraminal narrowing at any level. Upper chest: No consolidation within the imaged lung apices. No visible pneumothorax. IMPRESSION:  CT head: 1. No evidence of acute intracranial abnormality. 2. Partially empty sella turcica. This finding is very commonly incidental, but can be associated with idiopathic intracranial hypertension. 3. Mild paranasal sinus disease at the imaged levels. Correlate for acute sinusitis. CT cervical spine: 1. No evidence of acute fracture to the cervical spine. 2. Nonspecific reversal of the expected cervical lordosis. Electronically Signed   By: Jackey Loge DO   On: 06/24/2020 12:09   DG FL GUIDED LUMBAR PUNCTURE  Result Date: 06/24/2020 CLINICAL DATA:  Headache.  Papilledema. EXAM: DIAGNOSTIC LUMBAR PUNCTURE UNDER FLUOROSCOPIC GUIDANCE FLUOROSCOPY TIME:  17 seconds; 21  uGym2 DAP PROCEDURE: Informed consent was obtained from the patient prior to the procedure, including potential complications of headache, allergy, and pain. With the patient prone, the lower back was prepped with Betadine. 1% Lidocaine was used for local anesthesia. Lumbar puncture was performed at the L3-4 level from a right parasagittal approach using a 20 gauge needle with return of clear colorless CSF with an opening pressure of 16 cm water. 5 ml of CSF were obtained for laboratory studies. The patient tolerated the procedure well. Immediately post procedure, patient had a witnessed near syncopal episode. She remain mildly hypertensive, pulse normal, good  O2 sats. Because of confusion and some transient verbal limitations, she was transported by EMS to [facility] ED for further evaluation. IMPRESSION: Technically successful lumbar puncture under fluoroscopy. Electronically Signed   By: Corlis Leak M.D.   On: 06/24/2020 11:22    Procedures Procedures   Medications Ordered in ED Medications  sodium chloride 0.9 % bolus 1,000 mL (0 mLs Intravenous Stopped 06/24/20 1223)  metoCLOPramide (REGLAN) injection 10 mg (10 mg Intravenous Given 06/24/20 1235)    ED Course  I have reviewed the triage vital signs and the nursing notes.  Pertinent labs & imaging results that were available during my care of the patient were reviewed by me and considered in my medical decision making (see chart for details).  Clinical Course as of 06/24/20 1321  Wed Jun 24, 2020  1121 Acetazolamide 500mg  bid- if OP high Ct head repeat today Orthostatics- fluids Per neuro [HK]  1129 Spoke to Dr. 1130 from neurology again.  States that he spoke to radiologist about patient's episode.  He believes this is more presyncopal.  Reports she is opening pressure of 16 so she does not need to be on acetazolamide.  Recommends proceeding with lab work and imaging but can be most likely discharged to follow-up outpatient with neurology. [HK]  1207 Glucose-Capillary(!): 121 [HK]    Clinical Course User Index [HK] 1208, PA-C   MDM Rules/Calculators/A&P                          [phone] F with a past medical history of diabetes, hypertension, chronic headaches presenting to the ED with a chief complaint of syncope.  Patient has been having persistent migraines and sees a neurologist outpatient.  She underwent MRI of the brain with and without contrast about 10 days ago which showed nonspecific findings that could be related to her chronic migraines as well as findings for possible pseudotumor.  He underwent an IR guided LP and chart review shows that she may have had a  brief syncopal episode after the procedure was completed.  She continues to have a headache and blurry vision.  She states that her headache does feel slightly different than usual.  Denies any numbness in arms or  legs, vomiting, diarrhea, neck stiffness.  On exam patient without any numbness or weakness in bilateral upper and lower extremities.  No facial asymmetry noted.  She does report some neck pain but denies any fall to the ground.  C-collar is in place.  Moving all extremities without difficulty.  Denies any chest pain.  EKG shows normal sinus rhythm.  LP report shows opening pressure of sixteen.  Her neurologist, Dr. Amada JupiterKirkpatrick spoke to radiologist who described the episode and was concerned that this may be presyncopal or related to the procedure.  CT of the head today shows no acute findings, does shows findings consistent with a sinusitis.  She is having symptoms related to this so we will treat with antibiotics.  CT of the cervical spine without any abnormalities.  CBC, CMP unremarkable.  CBG is normal and hCG is negative.  Patient ambulated here without any difficulty.  Per neurology recommendation she can follow-up outpatient with her neurologist.  No need to start acetazolamide at this time based on her opening pressure.  Her symptoms have improved with Reglan.  Suspect complicated migraine.  Tubes for CSF analysis were collected but not resulted at this time.  We will have her follow-up with neurology and return for worsening symptoms.  Patient is hemodynamically stable, in NAD, and able to ambulate in the ED. Evaluation does not show pathology that would require ongoing emergent intervention or inpatient treatment. I explained the diagnosis to the patient. Pain has been managed and has no complaints prior to discharge. Patient is comfortable with above plan and is stable for discharge at this time. All questions were answered prior to disposition. Strict return precautions for returning to the  ED were discussed. Encouraged follow up with PCP.   An After Visit Summary was printed and given to the patient.   Portions of this note were generated with Scientist, clinical (histocompatibility and immunogenetics)Dragon dictation software. Dictation errors may occur despite best attempts at proofreading.  Final Clinical Impression(s) / ED Diagnoses Final diagnoses:  Acute sinusitis, recurrence not specified, unspecified location  Near syncope    Rx / DC Orders ED Discharge Orders         Ordered    amoxicillin-clavulanate (AUGMENTIN) 875-125 MG tablet  Every 12 hours        06/24/20 1321           Dietrich PatesKhatri, Dyllin Gulley, PA-C 06/24/20 1321    Tegeler, Canary Brimhristopher J, MD 06/24/20 1429

## 2020-06-24 NOTE — Telephone Encounter (Signed)
I called the patient and left a message.  Lumbar puncture showed an opening pressure of 16 cm, the patient does not have pseudotumor.  She does have migraine headaches however.  I will get her started on Topamax, she is to wait 2 days following the spinal tap before she starts the medication.  She will be given a prescription for Maxalt to take if needed.

## 2020-06-24 NOTE — ED Notes (Signed)
C-collar removed by Idelle Leech, PA-C

## 2020-06-29 ENCOUNTER — Telehealth: Payer: Self-pay | Admitting: Neurology

## 2020-06-29 NOTE — Telephone Encounter (Signed)
Mother of April [name], who is her POA , called today after hours to ask what her daughter's medication is prescribed for, what her diagnosis is, what the condition is caused by, and if the medication prescribed is compatible with her daughters other medication.   TOPIRAMATE was prescribed - and she had a spinal tap on Wednesday,  noted were 16 cm water as the opening pressure - not elevated for BMI.   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 I briefly explained the mechanism of fluid built up and that hormonal birth control,  teatracyclines and RetinA can create higher intrcranial pressures, and obesity is a main risk factor for this condition. The patient has an implant for hormonal BC.  PS : [name] explained that her daughter has mental disabilities and would not be able to ask these questions herself.  She will start the new medication tonight.   Melvyn Novas, MD

## 2020-07-01 ENCOUNTER — Encounter: Payer: Self-pay | Admitting: Obstetrics

## 2020-07-01 ENCOUNTER — Ambulatory Visit (INDEPENDENT_AMBULATORY_CARE_PROVIDER_SITE_OTHER): Payer: Medicare Other | Admitting: Obstetrics

## 2020-07-01 ENCOUNTER — Other Ambulatory Visit: Payer: Self-pay

## 2020-07-01 VITALS — BP 137/90 | HR 71 | Ht 63.0 in | Wt 248.0 lb

## 2020-07-01 DIAGNOSIS — Z3009 Encounter for other general counseling and advice on contraception: Secondary | ICD-10-CM | POA: Diagnosis not present

## 2020-07-01 DIAGNOSIS — I10 Essential (primary) hypertension: Secondary | ICD-10-CM | POA: Diagnosis not present

## 2020-07-01 DIAGNOSIS — Z8669 Personal history of other diseases of the nervous system and sense organs: Secondary | ICD-10-CM

## 2020-07-01 NOTE — Progress Notes (Addendum)
Subjective:    April [name] is a 34 y.o. female who presents for contraception counseling. The patient has no complaints today. The patient is sexually active. Pertinent past medical history: hypertension and migraines.  The information documented in the HPI was reviewed and verified.  Menstrual History: OB History    Gravida  1   Para  1   Term  0   Preterm  1   AB  0   Living  1     SAB  0   IAB  0   Ectopic  0   Multiple  0   Live Births               No LMP recorded. (Menstrual status: IUD).   Patient Active Problem List   Diagnosis Date Noted  . 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 Secondary amenorrhea 09/24/2012   Past Medical History:  Diagnosis Date  . Carpal tunnel syndrome on left   . Diabetes mellitus without complication (HCC) 05/2012  . Headache   . Hypertension   . Insomnia   . 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 Schizo affective schizophrenia [facility])     Past Surgical History:  Procedure Laterality Date  . BREAST LUMPECTOMY Left   . CARPAL TUNNEL RELEASE Left   . WISDOM TOOTH EXTRACTION       Current Outpatient Medications:  .  amoxicillin-clavulanate (AUGMENTIN) 875-125 MG tablet, Take 1 tablet by mouth every 12 (twelve) hours., Disp: 14 tablet, Rfl: 0 .  clonazePAM (KLONOPIN) 0.5 MG tablet, Take 0.5 mg by mouth 2 (two) times daily as needed for anxiety., Disp: , Rfl:  .  cloZAPine (CLOZARIL) 25 MG tablet, Take 25 mg by mouth daily. Take 1 1/2 tablet PO every hs, Disp: , Rfl:  .  fluPHENAZine (PROLIXIN) 5 MG tablet, Take 2.5 mg by mouth daily., Disp: , Rfl:  .  lisinopril (PRINIVIL,ZESTRIL) 2.5 MG tablet, Take 2.5 mg by mouth daily., Disp: , Rfl:  .  rizatriptan (MAXALT-MLT) 10 MG disintegrating tablet, 1 tablet up to 3 times daily if needed for headache, Disp: 9 tablet, Rfl: 3 .  sitaGLIPtin (JANUVIA) 50 MG tablet, Take 50 mg by mouth daily., Disp: , Rfl:  .  topiramate (TOPAMAX) 25 MG tablet, Take one tablet at night for one week, then take 2 tablets  at night for one week, then take 3 tablets at night., Disp: 90 tablet, Rfl: 3 Allergies  Allergen Reactions  . Latex Dermatitis  . Metformin Nausea Only  . Olanzapine Diarrhea and Nausea Only  . Other     pollen    Social History   Tobacco Use  . Smoking status: Never Smoker  . Smokeless tobacco: Never Used  Substance Use Topics  . Alcohol use: No    Family History  Problem Relation Age of Onset  . Diabetes Maternal Aunt   . Glaucoma Maternal Aunt   . Diabetes Maternal Aunt   . Heart attack Father   . Obesity Father   . Diabetes Father   . Schizophrenia Brother        Review of Systems Constitutional: negative for weight loss Genitourinary:negative for abnormal menstrual periods and vaginal discharge Neuro: positive for severe migraines with aura   Objective:   BP 137/90   Pulse 71   Ht 5\' 3"  (1.6 m)   Wt 248 lb (112.5 kg)   BMI 43.93 kg/m    General:   alert and no distress  Skin:   no rash or abnormalities  Lungs:  clear to auscultation bilaterally  Heart:   regular rate and rhythm, S1, S2 normal, no murmur, click, rub or gallop    Lab Review Urine pregnancy test Labs reviewed yes Radiologic studies reviewed yes  >50% of 15 min visit spent on counseling and coordination of care.    Assessment: and Plan    35 y.o., plans to discontinue Mirena IUD and start the non-hormonal  ParaGuard IUD because of her medical history of severe migraines with aura, and hypertension.  Educational material on the ParaGuard IUD was dispensed and she will discuss the change to a non-hormonal ParaGuard IUD with her Neurologist.  1. Encounter for counseling regarding contraception - recommended discontinuing Mirena IUD and starting the non-hormonal ParaGuard IUD because of her history of severe migraines with aura and hypertension.  2. Hx of migraines with aura  3. HTN (hypertension), benign - clinically stable on meds  4. Obesity with BMI of 43 - program of caloric  reduction, exercise and behavioral modification recommended   All questions answered. Contraception: IUD. Agricultural engineer distributed. Follow up as needed.    Brock Bad, MD 07/01/2020 4:15 PM

## 2020-07-01 NOTE — Progress Notes (Signed)
RGYN patient presents for IUD removal  Last Seen in office on 03/02/20.   CC: Patient states she needs to talk with provider first before taking IUD out. Pt expressed frustration  Pt believes IUD is causing hormonal issues.  Pt recently went to Neurologist. pt was told  Neurologist that IUD could be causing issues. Pt had to have a Spinal tap done last week.

## 2020-07-02 ENCOUNTER — Telehealth: Payer: Self-pay | Admitting: Neurology

## 2020-07-02 NOTE — Telephone Encounter (Signed)
Pt.'s mom Mrs. Basford, asks how long does pt. need to stay on Topomax. She states that her daughter's PCP has informed her that she should've never been on birth control Mederna. She states she was told that the birth control has a side effect & interacting with other medications that is causing the spinal fluid build up. She states doctor wants to remove IUD, but wants to speak with Neurologist first. Please advise.

## 2020-07-03 NOTE — Telephone Encounter (Signed)
I called the mother.  The patient does not have pseudotumor cerebri, opening pressure was 16.  She is going on Topamax now because of pseudotumor but because of migraine headache.  She will stay on 75 mg at night, we can adjust the dose if needed.

## 2020-07-08 ENCOUNTER — Ambulatory Visit (INDEPENDENT_AMBULATORY_CARE_PROVIDER_SITE_OTHER): Payer: Medicare Other | Admitting: Neurology

## 2020-07-08 ENCOUNTER — Telehealth: Payer: Self-pay | Admitting: Neurology

## 2020-07-08 ENCOUNTER — Encounter: Payer: Self-pay | Admitting: Neurology

## 2020-07-08 DIAGNOSIS — G43019 Migraine without aura, intractable, without status migrainosus: Secondary | ICD-10-CM | POA: Diagnosis not present

## 2020-07-08 HISTORY — DX: Migraine without aura, intractable, without status migrainosus: G43.019

## 2020-07-08 NOTE — Telephone Encounter (Signed)
Pt did not wish to schedule 3 month f/u at this time- will call us if anything changes.

## 2020-07-08 NOTE — Progress Notes (Signed)
Reason for visit: Migraine headache  April [name] is an 35 y.o. female  History of present illness:  April [name] is a 35 year old right-handed black female with history of intractable headaches.  The patient was felt initially to have papilledema, she underwent a lumbar puncture and opening pressure was 16 cm, effectively excluding the diagnosis of pseudotumor cerebri.  The patient has been placed on Topamax for presumed migraine.  She does have photophobia and phonophobia and some nausea with the headache.  The headaches are occurring about every other day.  She has Excedrin Migraine to take, she does not take in a lot of other caffeinated products.  The headaches worsened over the last 6 months.  The patient was given a prescription for Maxalt but she has not yet taken it.  She returns to the office today for an evaluation.  Past Medical History:  Diagnosis Date  . Carpal tunnel syndrome on left   . Diabetes mellitus without complication (HCC) 05/2012  . Headache   . Hypertension   . Insomnia   . 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 Schizo affective schizophrenia [facility])     Past Surgical History:  Procedure Laterality Date  . BREAST LUMPECTOMY Left   . CARPAL TUNNEL RELEASE Left   . WISDOM TOOTH EXTRACTION      Family History  Problem Relation Age of Onset  . Diabetes Maternal Aunt   . Glaucoma Maternal Aunt   . Diabetes Maternal Aunt   . Heart attack Father   . Obesity Father   . Diabetes Father   . Schizophrenia Brother     Social history:  reports that she has never smoked. She has never used smokeless tobacco. She reports that she does not drink alcohol and does not use drugs.    Allergies  Allergen Reactions  . Latex Dermatitis  . Metformin Nausea Only  . Olanzapine Diarrhea and Nausea Only  . Other     pollen    Medications:  Prior to Admission medications   Medication Sig Start Date End Date Taking? Authorizing Provider  clonazePAM (KLONOPIN) 0.5 MG tablet  Take 0.5 mg by mouth 2 (two) times daily as needed for anxiety.   Yes [provider]  cloZAPine (CLOZARIL) 25 MG tablet Take 25 mg by mouth daily. Take 1 1/2 tablet PO every hs   Yes [provider]  fluPHENAZine (PROLIXIN) 5 MG tablet Take 2.5 mg by mouth daily.   Yes [provider]  lisinopril (PRINIVIL,ZESTRIL) 2.5 MG tablet Take 2.5 mg by mouth daily.   Yes [provider]  rizatriptan (MAXALT-MLT) 10 MG disintegrating tablet 1 tablet up to 3 times daily if needed for headache 06/24/20  Yes York Spaniel, MD  sitaGLIPtin (JANUVIA) 50 MG tablet Take 50 mg by mouth daily.   Yes [provider]  topiramate (TOPAMAX) 25 MG tablet Take one tablet at night for one week, then take 2 tablets at night for one week, then take 3 tablets at night. 06/24/20  Yes York Spaniel, MD  amoxicillin-clavulanate (AUGMENTIN) 875-125 MG tablet Take 1 tablet by mouth every 12 (twelve) hours. 06/24/20   Khatri, Hina, PA-C    ROS:  Out of a complete 14 system review of symptoms, the patient complains only of the following symptoms, and all other reviewed systems are negative.  Headaches Nausea  Blood pressure 136/85, pulse 88, height 5' 3.5" (1.613 m), weight 249 lb (112.9 kg).  Physical Exam  General: The patient is alert and  cooperative at the time of the examination.  The patient is moderately obese.  Skin: No significant peripheral edema is noted.   Neurologic Exam  Mental status: The patient is alert and oriented x 3 at the time of the examination. The patient has apparent normal recent and remote memory, with an apparently normal attention span and concentration ability.   Cranial nerves: Facial symmetry is present. Speech is normal, no aphasia or dysarthria is noted. Extraocular movements are full. Visual fields are full.  Motor: The patient has good strength in all 4 extremities.  Sensory examination: Soft touch sensation is symmetric on the face,  arms, and legs.  Coordination: The patient has good finger-nose-finger and heel-to-shin bilaterally.  Gait and station: The patient has a normal gait. Tandem gait is normal. Romberg is negative. No drift is seen.  Reflexes: Deep tendon reflexes are symmetric.   Assessment/Plan:  1.  Intractable migraine headache  The patient is working up on the Topamax, she currently is on 50 mg nightly dose, after she has been on the 75 mg dose for a couple weeks, she is to contact our office if she is still having frequent headaches.  We will continue to increase the dose of the Topamax as long she is tolerating this.  She will follow up here in 3 months.   Marlan Palau MD 07/08/2020 4:23 PM  [facility] [address] [place], [place] [address]  Phone [phone] Fax [phone]

## 2020-07-13 ENCOUNTER — Encounter: Payer: Medicare Other | Attending: Obstetrics and Gynecology | Admitting: Skilled Nursing Facility1

## 2020-07-13 ENCOUNTER — Other Ambulatory Visit: Payer: Self-pay

## 2020-07-13 DIAGNOSIS — E119 Type 2 diabetes mellitus without complications: Secondary | ICD-10-CM | POA: Insufficient documentation

## 2020-07-13 DIAGNOSIS — E669 Obesity, unspecified: Secondary | ICD-10-CM | POA: Diagnosis present

## 2020-07-13 NOTE — Progress Notes (Signed)
  Assessment:  Primary concerns today: Gaining weight  Pt has a strong craving for carbohydrates/simple sugars/high fat: Dietitian educated pt on this possibly being attributed to lack of sleep, mental health medications and stress  Pt stets her A1C is 6.6.  Pt states she has gone up to a size 18. 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 Pt states he wakes at 4am and then 5am starts eating. Pt states she has had better self control with her foods for example 4 cookies instead of 12 cookies having been able to accomplish this with talking with friends and family seeing what some family members deaths were related too and talks herself through the amount of food she eats. Pt states since working at her job she has had more control of her anxiety and mental health in general.  Pt states she is really proud of herself for not eating the whole box of doughnuts anymore. Pt states her son keeps her busy on the weekends due to his junior basketball league that travels keeping her out of the house.    Body Composition Scale Date 04/06/2020 07/13/2020  Current Body Weight 236.6 237.3 249.6  Total Body Fat % 43.6 43.5 44.7  Visceral Fat 13 13 14   Fat-Free Mass % 56.3 56.4 55.2   Total Body Water % 42.6 42.7 42.1  Muscle-Mass lbs 32 32.3 32.4  BMI 41.5 41.1 43.3  Body Fat Displacement            Torso  lbs 64 63.9 69.2         Left Leg  lbs 12.8 12.7 13.8         Right Leg  lbs 12.8 12.7 13.8         Left Arm  lbs 6.4 6.3 6.9         Right Arm   lbs 6.4 6.3 6.9    MEDICATIONS: see list   DIETARY INTAKE:  Usual eating pattern includes 3 meals and 3 snacks per day.  Everyday foods include doughnuts.  Avoided foods include none stated.    24-hr recall:  B (5 AM): 2 bowls of cereal + 2 bottles of water Snk (8 AM): 1 stick cheese and honey wheat 1/2 sleeve crackers L (10am AM): yogurt smoothie Snk (12:30-1 PM): spinach and herb wrap + turky + cheese + spring mix + honey mustard + cheetos  D (  PM): double fish fillet and cookies or subway 4 cookies Sank: Beverages: juice, soda, water  Usual physical activity: walking at work  Estimated energy needs: 1600 calories  Progress Towards Goal(s):  In progress.     Intervention:  Nutrition counseling. Dietitian educated pt on healthy eating within the context of weight management and diabetes control.   Continued:  -practicing self-control habits by limiting portion sizes and understanding what your body is needing in the moment  -staying active and enjoying movement to mentally feel better  -finding activities to stay busy during weekend and winter months  Teaching Method Utilized:  Visual Auditory Hands on  Handouts previously given during visit include:  Detailed MyPlate  Barriers to learning/adherence to lifestyle change: strong cravings   Demonstrated degree of understanding via:  Teach Back   Monitoring/Evaluation:  Dietary intake, exercise, and body weight prn.

## 2020-08-26 ENCOUNTER — Ambulatory Visit (INDEPENDENT_AMBULATORY_CARE_PROVIDER_SITE_OTHER): Payer: Medicare Other | Admitting: Obstetrics

## 2020-08-26 ENCOUNTER — Other Ambulatory Visit: Payer: Self-pay

## 2020-08-26 ENCOUNTER — Encounter: Payer: Self-pay | Admitting: Obstetrics

## 2020-08-26 VITALS — BP 145/97 | HR 84 | Ht 63.5 in | Wt 240.0 lb

## 2020-08-26 DIAGNOSIS — Z1239 Encounter for other screening for malignant neoplasm of breast: Secondary | ICD-10-CM | POA: Diagnosis not present

## 2020-08-26 DIAGNOSIS — Z01419 Encounter for gynecological examination (general) (routine) without abnormal findings: Secondary | ICD-10-CM

## 2020-08-26 DIAGNOSIS — Z6841 Body Mass Index (BMI) 40.0 and over, adult: Secondary | ICD-10-CM

## 2020-08-26 DIAGNOSIS — Z30433 Encounter for removal and reinsertion of intrauterine contraceptive device: Secondary | ICD-10-CM

## 2020-08-26 DIAGNOSIS — Z975 Presence of (intrauterine) contraceptive device: Secondary | ICD-10-CM

## 2020-08-26 MED ORDER — LEVONORGESTREL 20 MCG/DAY IU IUD
1.0000 | INTRAUTERINE_SYSTEM | Freq: Once | INTRAUTERINE | Status: AC
  Administered 2020-08-26: 1 via INTRAUTERINE

## 2020-08-26 NOTE — Progress Notes (Addendum)
GYN presents for AEX/IUD removal and insertion.  C/o spotting last week.  She reports a strong Family history of breast Caner, patient refused BRACA testing.  PHQ-9=1  Last PAP was 01/02/2020  Administrations This Visit    levonorgestrel (MIRENA) 20 MCG/DAY IUD 1 each    Admin Date 08/26/2020 Action Given Dose 1 each Route Intrauterine Administered By Maretta Bees, RMA

## 2020-08-26 NOTE — Progress Notes (Signed)
Subjective:        April [name] is a 35 y.o. female here for a routine exam.  Current complaints: None.    Personal health questionnaire:  Is patient Ashkenazi Jewish, have a family history of breast and/or ovarian cancer: no Is there a family history of uterine cancer diagnosed at age < 49, gastrointestinal cancer, urinary tract cancer, family member who is a Personnel officer syndrome-associated carrier: no Is the patient overweight and hypertensive, family history of diabetes, personal history of gestational diabetes, preeclampsia or PCOS: yes Is patient over 100, have PCOS,  family history of premature CHD under age 63, diabetes, smoke, have hypertension or peripheral artery disease:  no At any time, has a partner hit, kicked or otherwise hurt or frightened you?: no Over the past 2 weeks, have you felt down, depressed or hopeless?: no Over the past 2 weeks, have you felt little interest or pleasure in doing things?:no   Gynecologic History No LMP recorded. (Menstrual status: IUD). Contraception: IUD Last Pap: 01-02-2020. Results were: normal Last mammogram: n/a. Results were: n/a  Obstetric History OB History  Gravida Para Term Preterm AB Living  1 1 0 1 0 1  SAB IAB Ectopic Multiple Live Births  0 0 0 0      # Outcome Date GA Lbr Len/2nd Weight Sex Delivery Anes PTL Lv  1 Preterm 01/04/04 [redacted]w[redacted]d   M Vag-Spont       Past Medical History:  Diagnosis Date  . Carpal tunnel syndrome on left   . Common migraine with intractable migraine 07/08/2020  . Diabetes mellitus without complication (HCC) 05/2012  . Headache   . Hypertension   . Insomnia   . 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 Schizo affective schizophrenia [facility])     Past Surgical History:  Procedure Laterality Date  . BREAST LUMPECTOMY Left   . CARPAL TUNNEL RELEASE Left   . WISDOM TOOTH EXTRACTION       Current Outpatient Medications:  .  clonazePAM (KLONOPIN) 0.5 MG tablet, Take 0.5 mg by mouth 2 (two) times daily as needed  for anxiety., Disp: , Rfl:  .  cloZAPine (CLOZARIL) 25 MG tablet, Take 25 mg by mouth daily. Take 1 1/2 tablet PO every hs, Disp: , Rfl:  .  fluPHENAZine (PROLIXIN) 5 MG tablet, Take 2.5 mg by mouth daily., Disp: , Rfl:  .  lisinopril (PRINIVIL,ZESTRIL) 2.5 MG tablet, Take 2.5 mg by mouth daily., Disp: , Rfl:  .  rizatriptan (MAXALT-MLT) 10 MG disintegrating tablet, 1 tablet up to 3 times daily if needed for headache, Disp: 9 tablet, Rfl: 3 .  sitaGLIPtin (JANUVIA) 50 MG tablet, Take 50 mg by mouth daily., Disp: , Rfl:  .  topiramate (TOPAMAX) 25 MG tablet, Take one tablet at night for one week, then take 2 tablets at night for one week, then take 3 tablets at night., Disp: 90 tablet, Rfl: 3 Allergies  Allergen Reactions  . Latex Dermatitis  . Metformin Nausea Only  . Olanzapine Diarrhea and Nausea Only  . Other     pollen    Social History   Tobacco Use  . Smoking status: Never Smoker  . Smokeless tobacco: Never Used  Substance Use Topics  . Alcohol use: Yes    Comment: rarely    Family History  Problem Relation Age of Onset  . Diabetes Maternal Aunt   . Glaucoma Maternal Aunt   . Cancer Maternal Aunt   . Diabetes Maternal Aunt   . Cancer Maternal Aunt   .  Cancer Mother   . Heart attack Father   . Obesity Father   . Diabetes Father   . Schizophrenia Brother   . Cancer Maternal Grandmother       Review of Systems  Constitutional: negative for fatigue and weight loss Respiratory: negative for cough and wheezing Cardiovascular: negative for chest pain, fatigue and palpitations Gastrointestinal: negative for abdominal pain and change in bowel habits Musculoskeletal:negative for myalgias Neurological: negative for gait problems and tremors Behavioral/Psych: negative for abusive relationship, depression Endocrine: negative for temperature intolerance    Genitourinary:negative for abnormal menstrual periods, genital lesions, hot flashes, sexual problems and vaginal  discharge Integument/breast: negative for breast lump, breast tenderness, nipple discharge and skin lesion(s)    Objective:       BP (!) 145/97   Pulse 84   Ht 5' 3.5" (1.613 m)   Wt 240 lb (108.9 kg)   BMI 41.85 kg/m  General:   alert and no distress  Skin:   no rash or abnormalities  Lungs:   clear to auscultation bilaterally  Heart:   regular rate and rhythm, S1, S2 normal, no murmur, click, rub or gallop  Breasts:   normal without suspicious masses, skin or nipple changes or axillary nodes  Abdomen:  normal findings: no organomegaly, soft, non-tender and no hernia  Pelvis:  External genitalia: normal general appearance Urinary system: urethral meatus normal and bladder without fullness, nontender Vaginal: normal without tenderness, induration or masses Cervix: normal appearance Adnexa: normal bimanual exam Uterus: anteverted and non-tender, normal size   Lab Review Urine pregnancy test Labs reviewed yes Radiologic studies reviewed yes  I have spent a total of 25 minutes of face-to-face time, excluding clinical staff time, reviewing notes and preparing to see patient, ordering tests and/or medications, and counseling the patient.   Assessment:     1. Encounter for gynecological examination with Papanicolaou smear of cervix  2. Encounter for removal and reinsertion of IUD Rx: - levonorgestrel (MIRENA) 20 MCG/DAY IUD 1 each  3. Screening breast examination Rx: - MM Digital Screening; Future  4. Class 3 severe obesity without serious comorbidity with body mass index (BMI) of 40.0 to 44.9 in adult, unspecified obesity type (HCC) - program of caloric reduction, exercise and behavioral modification recommended  5. IUD (intrauterine device) in place - due for removal and reinsertion today    Plan:    Education reviewed: calcium supplements, depression evaluation, low fat, low cholesterol diet, safe sex/STD prevention, self breast exams and weight bearing exercise.   Follow up in 1 year  Meds ordered this encounter  Medications  . levonorgestrel (MIRENA) 20 MCG/DAY IUD 1 each   Orders Placed This Encounter  Procedures  . MM Digital Screening    Standing Status:   Future    Standing Expiration Date:   08/26/2021    Order Specific Question:   Reason for Exam (SYMPTOM  OR DIAGNOSIS REQUIRED)    Answer:   Screening    Order Specific Question:   Is the patient pregnant?    Answer:   No    Order Specific Question:   Preferred imaging location?    Answer:   [facility]    Brock Bad, MD 08/26/2020 10:56 AM

## 2020-08-26 NOTE — Progress Notes (Signed)
    GYNECOLOGY OFFICE PROCEDURE NOTE  April [name] is a 35 y.o. G1P0101 here for MIRENA IUD removal and reinsertion. No GYN concerns.  Last pap smear was on 01-02-2020 and was normal.  IUD Removal and Reinsertion  Patient identified, informed consent performed, consent signed.   Discussed risks of irregular bleeding, cramping, infection, malpositioning or misplacement of the IUD outside the uterus which may require further procedures. Also discussed >99% contraception efficacy, increased risk of ectopic pregnancy with failure of method.   Emphasized that this did not protect against STIs, condoms recommended during all sexual encounters.Advised to use backup contraception for one week as the risk of pregnancy is higher during the transition period of removing an IUD and replacing it with another one. Time out was performed. Speculum placed in the vagina. The strings of the IUD were grasped and pulled using ring forceps. The IUD was successfully removed in its entirety. The cervix was cleaned with Betadine x 2 and grasped anteriorly with a single tooth tenaculum.  The new MIRENA IUD insertion apparatus was used to sound the uterus to 7 cm;  the IUD was then placed per manufacturer's recommendations. Strings trimmed to 3 cm. Tenaculum was removed, good hemostasis noted. Patient tolerated procedure well.   Patient was given post-procedure instructions.  She was reminded to have backup contraception for one week during this transition period between IUDs.  Patient was also asked to check IUD strings periodically and follow up in 4 weeks for IUD check.  IUD:  MIRENA Lot #:[id number] Exp:  2024 / AUG  Brock Bad, MD, FACOG Obstetrician & Gynecologist, [facility] [employer], [facility] 08/26/20

## 2020-09-04 ENCOUNTER — Telehealth: Payer: Self-pay | Admitting: Neurology

## 2020-09-04 MED ORDER — VENLAFAXINE HCL ER 37.5 MG PO CP24: ORAL_CAPSULE | ORAL | 1 refills | Status: DC

## 2020-09-04 NOTE — Telephone Encounter (Signed)
I called the patient.  The patient has worked up to 3 of the 25 mg Topamax tablets at night but now is having significant side effects with nausea.  We will taper off the medication, she will go to 50 mg at night for 5 days and then 25 mg at night for 5 days and then stop.  I will start low-dose Effexor at 37.5 mg for 1 week and then go up to 75 mg a day.

## 2020-09-04 NOTE — Telephone Encounter (Signed)
Pt called, yesterday had a reaction to topiramate (TOPAMAX) 25 MG tablet. Having nausea, dizziness, unbalance, make me sleepy. This happens minutes after I take it. How do I taper off this medication.  Would like a call from the on call physician.

## 2020-09-28 ENCOUNTER — Ambulatory Visit: Payer: Medicare Other | Admitting: Neurology

## 2020-09-30 ENCOUNTER — Telehealth: Payer: Self-pay | Admitting: *Deleted

## 2020-09-30 NOTE — Telephone Encounter (Signed)
Venlafaxine ER approved PA Case: [id number], Status: Approved, Coverage Starts on: 04/25/2020 12:00:00 AM, Coverage Ends on: 04/24/2021 12:00:00 AM. Questions? Contact [phone]. Faxed approval to pharmacy.

## 2020-09-30 NOTE — Telephone Encounter (Signed)
Received fax from [facility] re: venlafaxine ER PA.  Key:  [id number], [id number]. allergic to Topamax. Your information has been sent to [facility].

## 2020-10-06 ENCOUNTER — Encounter: Payer: Self-pay | Admitting: Obstetrics

## 2020-10-06 ENCOUNTER — Other Ambulatory Visit: Payer: Self-pay

## 2020-10-06 ENCOUNTER — Ambulatory Visit (INDEPENDENT_AMBULATORY_CARE_PROVIDER_SITE_OTHER): Payer: Medicare Other | Admitting: Obstetrics

## 2020-10-06 ENCOUNTER — Other Ambulatory Visit (HOSPITAL_COMMUNITY)
Admission: RE | Admit: 2020-10-06 | Discharge: 2020-10-06 | Disposition: A | Discharge status: Home / Self Care | Payer: Medicare Other | Source: Ambulatory Visit | Attending: Obstetrics | Admitting: Obstetrics

## 2020-10-06 VITALS — BP 137/96 | HR 80 | Ht 63.5 in | Wt 249.0 lb

## 2020-10-06 DIAGNOSIS — N898 Other specified noninflammatory disorders of vagina: Secondary | ICD-10-CM

## 2020-10-06 DIAGNOSIS — Z30431 Encounter for routine checking of intrauterine contraceptive device: Secondary | ICD-10-CM | POA: Diagnosis not present

## 2020-10-06 LAB — POCT URINE PREGNANCY: Preg Test, Ur: NEGATIVE

## 2020-10-06 NOTE — Progress Notes (Signed)
Subjective:    April [name] is a 35 y.o. female who presents for contraception counseling. The patient has no complaints today. The patient is sexually active. Pertinent past medical history: none.  The information documented in the HPI was reviewed and verified.  Menstrual History: OB History     Gravida  1   Para  1   Term  0   Preterm  1   AB  0   Living  1      SAB  0   IAB  0   Ectopic  0   Multiple  0   Live Births               No LMP recorded. (Menstrual status: IUD).   Patient Active Problem List   Diagnosis Date Noted   Common migraine with intractable migraine 07/08/2020   Papilledema 05/29/2020   Secondary amenorrhea 09/24/2012   Past Medical History:  Diagnosis Date   Carpal tunnel syndrome on left    Common migraine with intractable migraine 07/08/2020   Diabetes mellitus without complication (HCC) 05/2012   Headache    Hypertension    Insomnia    Papilledema 05/29/2020   Schizo affective schizophrenia (HCC)     Past Surgical History:  Procedure Laterality Date   BREAST LUMPECTOMY Left    CARPAL TUNNEL RELEASE Left    WISDOM TOOTH EXTRACTION       Current Outpatient Medications:    clonazePAM (KLONOPIN) 0.5 MG tablet, Take 0.5 mg by mouth 2 (two) times daily as needed for anxiety., Disp: , Rfl:    cloZAPine (CLOZARIL) 25 MG tablet, Take 25 mg by mouth daily. Take 1 1/2 tablet PO every hs, Disp: , Rfl:    fluPHENAZine (PROLIXIN) 5 MG tablet, Take 2.5 mg by mouth daily., Disp: , Rfl:    lisinopril (PRINIVIL,ZESTRIL) 2.5 MG tablet, Take 2.5 mg by mouth daily., Disp: , Rfl:    rizatriptan (MAXALT-MLT) 10 MG disintegrating tablet, 1 tablet up to 3 times daily if needed for headache, Disp: 9 tablet, Rfl: 3   sitaGLIPtin (JANUVIA) 50 MG tablet, Take 50 mg by mouth daily., Disp: , Rfl:    venlafaxine XR (EFFEXOR XR) 37.5 MG 24 hr capsule, 1 tablet daily for 1 week, then take 2 daily, Disp: 60 capsule, Rfl: 1 Allergies  Allergen  Reactions   Latex Dermatitis   Metformin Nausea Only   Olanzapine Diarrhea and Nausea Only   Other     pollen   Topamax [Topiramate]     Nausea    Social History   Tobacco Use   Smoking status: Never   Smokeless tobacco: Never  Substance Use Topics   Alcohol use: Yes    Comment: rarely    Family History  Problem Relation Age of Onset   Diabetes Maternal Aunt    Glaucoma Maternal Aunt    Cancer Maternal Aunt    Diabetes Maternal Aunt    Cancer Maternal Aunt    Cancer Mother    Heart attack Father    Obesity Father    Diabetes Father    Schizophrenia Brother    Cancer Maternal Grandmother        Review of Systems Constitutional: negative for weight loss Genitourinary:negative for abnormal menstrual periods and vaginal discharge   Objective:   BP (!) 137/96   Pulse 80   Ht 5' 3.5" (1.613 m)   Wt 249 lb (112.9 kg)   BMI 43.42 kg/m    General:  Alert and no distress  Skin:   no rash or abnormalities  Lungs:   clear to auscultation bilaterally  Heart:   regular rate and rhythm, S1, S2 normal, no murmur, click, rub or gallop  Breasts:   Not examined  Abdomen:  normal findings: no organomegaly, soft, non-tender and no hernia  Pelvis:  External genitalia: normal general appearance Urinary system: urethral meatus normal and bladder without fullness, nontender Vaginal: normal without tenderness, induration or masses Cervix: normal appearance Adnexa: normal bimanual exam Uterus: anteverted and non-tender, normal size   Lab Review Urine pregnancy test Labs reviewed yes Radiologic studies reviewed no  I have spent a total of 15 minutes of face-to-face time, excluding clinical staff time, reviewing notes and preparing to see patient, ordering tests and/or medications, and counseling the patient.   Assessment:    35 y.o., continuing IUD, no contraindications.   Plan:    All questions answered. Contraception: IUD. Discussed healthy lifestyle  modifications. Follow up as needed.  Orders Placed This Encounter  Procedures   POCT urine pregnancy    Brock Bad, MD 10/06/2020 10:51 AM

## 2020-10-06 NOTE — Progress Notes (Signed)
Want pregnancy test. Reports tender breasts, cramping, nausea, and fatigue. Reports white vaginal discharge for the last few weeks. No odor.

## 2020-10-07 ENCOUNTER — Telehealth: Payer: Self-pay

## 2020-10-07 ENCOUNTER — Other Ambulatory Visit: Payer: Self-pay | Admitting: Obstetrics

## 2020-10-07 DIAGNOSIS — B3731 Acute candidiasis of vulva and vagina: Secondary | ICD-10-CM

## 2020-10-07 LAB — CERVICOVAGINAL ANCILLARY ONLY
Bacterial Vaginitis (gardnerella): NEGATIVE
Candida Glabrata: NEGATIVE
Candida Vaginitis: POSITIVE — AB
Comment: NEGATIVE
Comment: NEGATIVE
Comment: NEGATIVE

## 2020-10-07 MED ORDER — FLUCONAZOLE 200 MG PO TABS: 200.0000 mg | ORAL_TABLET | ORAL | 2 refills | Status: DC

## 2020-10-07 NOTE — Telephone Encounter (Signed)
Called patient to inform her of test results. No answer or voice mail to leave a message. 

## 2020-10-07 NOTE — Telephone Encounter (Signed)
-----   Message from Brock Bad, MD sent at 10/07/2020 12:05 PM EDT ----- Diflucan Rx for yeast

## 2020-10-07 NOTE — Telephone Encounter (Signed)
-----   Message from Charles A Harper, MD sent at 10/07/2020 12:05 PM EDT ----- Diflucan Rx for yeast 

## 2020-10-07 NOTE — Telephone Encounter (Signed)
Patient has been advised of test results.

## 2020-10-14 ENCOUNTER — Ambulatory Visit: Payer: Medicare Other | Admitting: Skilled Nursing Facility1

## 2020-11-09 ENCOUNTER — Ambulatory Visit: Payer: Medicare Other | Admitting: Neurology

## 2020-11-25 ENCOUNTER — Encounter: Payer: Medicare Other | Attending: Obstetrics and Gynecology | Admitting: Skilled Nursing Facility1

## 2020-11-25 ENCOUNTER — Other Ambulatory Visit: Payer: Self-pay

## 2020-11-25 DIAGNOSIS — E669 Obesity, unspecified: Secondary | ICD-10-CM | POA: Insufficient documentation

## 2020-11-25 DIAGNOSIS — E119 Type 2 diabetes mellitus without complications: Secondary | ICD-10-CM | POA: Diagnosis present

## 2020-11-25 NOTE — Progress Notes (Signed)
  Assessment:  Primary concerns today: Gaining weight  Pt states her A1C is 6.6.   Pt states she has been eating better: more greens, more water and has been sleeping better. Pt states she has less meds now which she believes has been the contributing factor to being able to make changes. Pt states she is actually having feelings now and has been learning her feelings.  Pt states due to the neuropathy she feels in her feet and kidney function concerns she will be meeting with an endocrinologist.  Pt states she was feeling funny and ended up having a high blood pressure so she went to the ED. Pt states she is very concerned with her blood sugars and blood pressures.  Pt states she has switched jobs and is under less stress.  Pt states she has really cut back on her soda intake.    Body Composition Scale Date 04/06/2020 07/13/2020 11/25/2020  Current Body Weight 236.6 237.3 249.6 240.6  Total Body Fat % 43.6 43.5 44.7 43.6  Visceral Fat 13 13 14 13   Fat-Free Mass % 56.3 56.4 55.2 56.3   Total Body Water % 42.6 42.7 42.1 42.6  Muscle-Mass lbs 32 32.3 32.4 32.5  BMI 41.5 41.1 43.3 41.2  Body Fat Displacement             Torso  lbs 64 63.9 69.2 65.1         Left Leg  lbs 12.8 12.7 13.8 13         Right Leg  lbs 12.8 12.7 13.8 13         Left Arm  lbs 6.4 6.3 6.9 6.5         Right Arm   lbs 6.4 6.3 6.9 6.5    MEDICATIONS: see list   DIETARY INTAKE:  Usual eating pattern includes 3 meals and 3 snacks per day.  Everyday foods include doughnuts.  Avoided foods include none stated.    24-hr recall: works nights B (5 AM): 2 bowls of cereal  + almond milk + 2 bottles of water Snk (8 AM): 1 stick cheese and honey wheat 1/2 sleeve crackers L (10am AM): spinach  and herb wrap with spring mix and chips Snk (12:30-1 PM):  D ( 6:30 PM): chicken alfredo Sank: Beverages: diet soda, water  Usual physical activity: walking at work  Estimated energy needs: 1600 calories  Progress Towards  Goal(s):  In progress.     Intervention:  Nutrition counseling. Dietitian educated pt on healthy eating within the context of weight management and diabetes control.   Continued:  -start checking your blood sugar 2 times a day 7 days a week and write them down for your endo; once when you first wake up before you eat anything and 2 hours after any meal, write next to the number whether it was before a meal or after, this will allow a more productive visit with your endocrinologist   -limit cereal to one bowl or 2 packets of oatmeal   -Add spring mix to your alfredo  -aim to only have half the bag of uncle bens   -Try the pasta made out of veggie or beans or whole wheat  Teaching Method Utilized:  Visual Auditory Hands on  Handouts previously given during visit include: Detailed MyPlate  Barriers to learning/adherence to lifestyle change: strong cravings; lessened recently   Demonstrated degree of understanding via:  Teach Back   Monitoring/Evaluation:  Dietary intake, exercise, and body weight prn.

## 2020-12-11 ENCOUNTER — Other Ambulatory Visit: Payer: Self-pay | Admitting: Obstetrics

## 2020-12-11 DIAGNOSIS — N6452 Nipple discharge: Secondary | ICD-10-CM

## 2020-12-11 DIAGNOSIS — N644 Mastodynia: Secondary | ICD-10-CM

## 2020-12-22 ENCOUNTER — Other Ambulatory Visit: Payer: Self-pay | Admitting: Obstetrics

## 2020-12-22 DIAGNOSIS — N644 Mastodynia: Secondary | ICD-10-CM

## 2020-12-22 DIAGNOSIS — N6452 Nipple discharge: Secondary | ICD-10-CM

## 2021-02-03 ENCOUNTER — Ambulatory Visit: Payer: Medicare Other

## 2021-02-23 ENCOUNTER — Other Ambulatory Visit: Payer: Medicare Other

## 2021-02-23 ENCOUNTER — Inpatient Hospital Stay: Admission: RE | Admit: 2021-02-23 | Payer: Medicare Other | Source: Ambulatory Visit

## 2021-04-08 ENCOUNTER — Other Ambulatory Visit: Payer: Self-pay

## 2021-04-08 ENCOUNTER — Other Ambulatory Visit (HOSPITAL_COMMUNITY)
Admission: RE | Admit: 2021-04-08 | Discharge: 2021-04-08 | Disposition: A | Discharge status: Home / Self Care | Payer: Medicare Other | Source: Ambulatory Visit | Attending: Obstetrics and Gynecology | Admitting: Obstetrics and Gynecology

## 2021-04-08 ENCOUNTER — Encounter: Payer: Self-pay | Admitting: Obstetrics

## 2021-04-08 ENCOUNTER — Ambulatory Visit (INDEPENDENT_AMBULATORY_CARE_PROVIDER_SITE_OTHER): Payer: Medicare Other | Admitting: Obstetrics

## 2021-04-08 VITALS — BP 112/67 | HR 58 | Ht 63.5 in | Wt 214.0 lb

## 2021-04-08 DIAGNOSIS — N939 Abnormal uterine and vaginal bleeding, unspecified: Secondary | ICD-10-CM | POA: Diagnosis not present

## 2021-04-08 DIAGNOSIS — Z30431 Encounter for routine checking of intrauterine contraceptive device: Secondary | ICD-10-CM | POA: Diagnosis not present

## 2021-04-08 DIAGNOSIS — N898 Other specified noninflammatory disorders of vagina: Secondary | ICD-10-CM | POA: Diagnosis present

## 2021-04-08 LAB — POCT URINE PREGNANCY: Preg Test, Ur: NEGATIVE

## 2021-04-08 NOTE — Progress Notes (Signed)
Patient ID: April [name], female   DOB: Mar 14, 1986, 35 y.o.   MRN: [id number]  Chief Complaint  Patient presents with   Dysmenorrhea   Vaginal Bleeding    HPI April [name] is a 35 y.o. female.  Complains of irregular vaginal spotting, cramping and nausea for 7 days.  Has a Mirena IUD for ~ 6 months. HPI  Past Medical History:  Diagnosis Date   Carpal tunnel syndrome on left    Common migraine with intractable migraine 07/08/2020   Diabetes mellitus without complication (HCC) 05/2012   Headache    Hypertension    Insomnia    Papilledema 05/29/2020   Schizo affective schizophrenia (HCC)     Past Surgical History:  Procedure Laterality Date   BREAST LUMPECTOMY Left    CARPAL TUNNEL RELEASE Left    WISDOM TOOTH EXTRACTION      Family History  Problem Relation Age of Onset   Diabetes Maternal Aunt    Glaucoma Maternal Aunt    Cancer Maternal Aunt    Diabetes Maternal Aunt    Cancer Maternal Aunt    Cancer Mother    Heart attack Father    Obesity Father    Diabetes Father    Schizophrenia Brother    Cancer Maternal Grandmother     Social History Social History   Tobacco Use   Smoking status: Never   Smokeless tobacco: Never  Substance Use Topics   Alcohol use: Yes    Comment: rarely   Drug use: No    Allergies  Allergen Reactions   Latex Dermatitis   Metformin Nausea Only   Olanzapine Diarrhea and Nausea Only   Other     pollen   Topamax [Topiramate]     Nausea    Current Outpatient Medications  Medication Sig Dispense Refill   fluPHENAZine (PROLIXIN) 5 MG tablet Take 2.5 mg by mouth daily.     lisinopril (PRINIVIL,ZESTRIL) 2.5 MG tablet Take 2.5 mg by mouth daily.     clonazePAM (KLONOPIN) 0.5 MG tablet Take 0.5 mg by mouth 2 (two) times daily as needed for anxiety.     cloZAPine (CLOZARIL) 25 MG tablet Take 25 mg by mouth daily. Take 1 1/2 tablet PO every hs     fluconazole (DIFLUCAN) 200 MG tablet Take 1 tablet (200 mg total) by mouth every  3 (three) days. (Patient not taking: Reported on 04/08/2021) 3 tablet 2   rizatriptan (MAXALT-MLT) 10 MG disintegrating tablet 1 tablet up to 3 times daily if needed for headache (Patient not taking: Reported on 04/08/2021) 9 tablet 3   sitaGLIPtin (JANUVIA) 50 MG tablet Take 50 mg by mouth daily.     venlafaxine XR (EFFEXOR XR) 37.5 MG 24 hr capsule 1 tablet daily for 1 week, then take 2 daily 60 capsule 1   No current facility-administered medications for this visit.    Review of Systems Review of Systems Constitutional: negative for fatigue and weight loss Respiratory: negative for cough and wheezing Cardiovascular: negative for chest pain, fatigue and palpitations Gastrointestinal: negative for abdominal pain and change in bowel habits Genitourinary:positive for vaginal spotting, cramping  Integument/breast: negative for nipple discharge Musculoskeletal:negative for myalgias Neurological: negative for gait problems and tremors Behavioral/Psych: negative for abusive relationship, depression Endocrine: negative for temperature intolerance      Blood pressure 112/67, pulse (!) 58, height 5' 3.5" (1.613 m), weight 214 lb (97.1 kg).  Physical Exam Physical Exam General:   Alert and no distress  Skin:   no rash  or abnormalities  Lungs:   clear to auscultation bilaterally  Heart:   regular rate and rhythm, S1, S2 normal, no murmur, click, rub or gallop  Breasts:   normal without suspicious masses, skin or nipple changes or axillary nodes  Abdomen:  normal findings: no organomegaly, soft, non-tender and no hernia  Pelvis:  External genitalia: normal general appearance Urinary system: urethral meatus normal and bladder without fullness, nontender Vaginal: normal without tenderness, induration or masses Cervix: normal appearance Adnexa: normal bimanual exam Uterus: anteverted and non-tender, normal size    I have spent a total of 20 minutes of face-to-face time, excluding clinical  staff time, reviewing notes and preparing to see patient, ordering tests and/or medications, and counseling the patient.   Data Reviewed Wet Prep  Assessment     1. Abnormal uterine bleeding (AUB) Rx: - POCT urine pregnancy - US PELVIC COMPLETE WITH TRANSVAGINAL; Future  2. Vaginal discharge Rx: - Cervicovaginal ancillary only( [place])  3. IUD check up - ultrasound ordered for position     Plan   Follow up in 2 weeks    Brock Bad, MD 04/08/2021 9:26 AM

## 2021-04-08 NOTE — Progress Notes (Signed)
GYN presents for spotting with Mirena IUD , cramping, nausea x 7 days

## 2021-04-09 ENCOUNTER — Other Ambulatory Visit: Payer: Self-pay | Admitting: Obstetrics

## 2021-04-09 DIAGNOSIS — B3731 Acute candidiasis of vulva and vagina: Secondary | ICD-10-CM

## 2021-04-09 LAB — CERVICOVAGINAL ANCILLARY ONLY
Bacterial Vaginitis (gardnerella): NEGATIVE
Candida Glabrata: NEGATIVE
Candida Vaginitis: POSITIVE — AB
Chlamydia: NEGATIVE
Comment: NEGATIVE
Comment: NEGATIVE
Comment: NEGATIVE
Comment: NEGATIVE
Comment: NEGATIVE
Comment: NORMAL
Neisseria Gonorrhea: NEGATIVE
Trichomonas: NEGATIVE

## 2021-04-09 MED ORDER — FLUCONAZOLE 200 MG PO TABS: 200.0000 mg | ORAL_TABLET | ORAL | 2 refills | Status: DC

## 2021-04-13 ENCOUNTER — Other Ambulatory Visit: Payer: Self-pay

## 2021-04-13 ENCOUNTER — Ambulatory Visit
Admission: RE | Admit: 2021-04-13 | Discharge: 2021-04-13 | Disposition: A | Discharge status: Home / Self Care | Payer: Medicare Other | Source: Ambulatory Visit | Attending: Obstetrics | Admitting: Obstetrics

## 2021-04-13 DIAGNOSIS — N939 Abnormal uterine and vaginal bleeding, unspecified: Secondary | ICD-10-CM

## 2021-06-07 ENCOUNTER — Telehealth: Payer: Self-pay

## 2021-06-07 NOTE — Telephone Encounter (Signed)
Returned call, no answer, left vm 

## 2021-06-15 ENCOUNTER — Ambulatory Visit: Payer: Medicare Other | Admitting: Obstetrics

## 2022-02-17 ENCOUNTER — Ambulatory Visit: Payer: Medicare HMO | Admitting: Obstetrics

## 2022-02-18 ENCOUNTER — Ambulatory Visit: Payer: Medicare HMO | Admitting: Obstetrics

## 2022-03-07 ENCOUNTER — Encounter: Payer: Self-pay | Admitting: Obstetrics and Gynecology

## 2022-03-07 ENCOUNTER — Ambulatory Visit (INDEPENDENT_AMBULATORY_CARE_PROVIDER_SITE_OTHER): Payer: Medicare HMO | Admitting: Obstetrics and Gynecology

## 2022-03-07 ENCOUNTER — Other Ambulatory Visit (HOSPITAL_COMMUNITY)
Admission: RE | Admit: 2022-03-07 | Discharge: 2022-03-07 | Disposition: A | Discharge status: Home / Self Care | Payer: Medicare HMO | Source: Ambulatory Visit | Attending: Obstetrics and Gynecology | Admitting: Obstetrics and Gynecology

## 2022-03-07 VITALS — BP 145/91 | HR 78 | Ht 63.5 in | Wt 261.0 lb

## 2022-03-07 DIAGNOSIS — Z01419 Encounter for gynecological examination (general) (routine) without abnormal findings: Secondary | ICD-10-CM

## 2022-03-07 DIAGNOSIS — R32 Unspecified urinary incontinence: Secondary | ICD-10-CM | POA: Diagnosis not present

## 2022-03-07 MED ORDER — FLUCONAZOLE 150 MG PO TABS: 150.0000 mg | ORAL_TABLET | Freq: Once | ORAL | 1 refills | Status: AC

## 2022-03-07 MED ORDER — NYSTATIN-TRIAMCINOLONE 100000-0.1 UNIT/GM-% EX OINT: 1.0000 | TOPICAL_OINTMENT | Freq: Two times a day (BID) | CUTANEOUS | 0 refills | Status: AC

## 2022-03-07 NOTE — Progress Notes (Signed)
Pt states she gets frequent yeast infection, would like to know if she can get refills for treatment.   Pt has IUD and doing well.

## 2022-03-07 NOTE — Progress Notes (Signed)
Subjective:     April [name] is a 36 y.o. female P1 with IUD induced amenorrhea and BMI 45 who is here for a comprehensive physical exam. The patient reports recurrent yeast infection and urinary incontinence. Patient reports treating herself almost weekly for yeast infection using OTC options. She also reports urinary incontinence for which she wears depends. She stated that once she has the urge to void, she cannot stop the flow from coming. She denies excess fluid or caffeine intake. These episodes happen randomly. She has soiled herself in public and in her car. Patient is sexually active without complaints. She denies issues related to constipation.   Past Medical History:  Diagnosis Date   Carpal tunnel syndrome on left    Common migraine with intractable migraine 07/08/2020   Diabetes mellitus without complication (HCC) 05/2012   Headache    Hypertension    Insomnia    Papilledema 05/29/2020   Schizo affective schizophrenia (HCC)    Past Surgical History:  Procedure Laterality Date   BREAST LUMPECTOMY Left    CARPAL TUNNEL RELEASE Left    WISDOM TOOTH EXTRACTION     Family History  Problem Relation Age of Onset   Diabetes Maternal Aunt    Glaucoma Maternal Aunt    Cancer Maternal Aunt    Diabetes Maternal Aunt    Cancer Maternal Aunt    Cancer Mother    Heart attack Father    Obesity Father    Diabetes Father    Schizophrenia Brother    Cancer Maternal Grandmother      Social History   Socioeconomic History   Marital status: Single    Spouse name: Not on file   Number of children: Not on file   Years of education: Not on file   Highest education level: Not on file  Occupational History   Occupation: full time  Tobacco Use   Smoking status: Never   Smokeless tobacco: Never  Substance and Sexual Activity   Alcohol use: Yes    Comment: rarely   Drug use: No   Sexual activity: Yes    Birth control/protection: I.U.D.  Other Topics Concern   Not on file   Social History Narrative   Lives with son and mother   Right Handed   Drinks soda occasionally   Social Determinants of Health   Financial Resource Strain: Not on file  Food Insecurity: Not on file  Transportation Needs: Not on file  Physical Activity: Not on file  Stress: Not on file  Social Connections: Not on file  Intimate Partner Violence: Not on file   Health Maintenance  Topic Date Due   HEMOGLOBIN A1C  Never done   Medicare Annual Wellness (AWV)  Never done   COVID-19 Vaccine (1) Never done   FOOT EXAM  Never done   OPHTHALMOLOGY EXAM  Never done   MAMMOGRAM  Never done   TETANUS/TDAP  Never done   Diabetic kidney evaluation - GFR measurement  06/24/2021   INFLUENZA VACCINE  11/23/2021   Diabetic kidney evaluation - Urine ACR  03/16/2022   PAP SMEAR-Modifier  01/02/2023   Hepatitis C Screening  Completed   HIV Screening  Completed   HPV VACCINES  Aged Out       Review of Systems Pertinent items noted in HPI and remainder of comprehensive ROS otherwise negative.   Objective:  Blood pressure (!) 145/91, pulse 78, height 5' 3.5" (1.613 m), weight 261 lb (118.4 kg).   GENERAL: Well-developed, well-nourished female  in no acute distress.  HEENT: Normocephalic, atraumatic. Sclerae anicteric.  NECK: Supple. Normal thyroid.  LUNGS: Clear to auscultation bilaterally.  HEART: Regular rate and rhythm. BREASTS: Symmetric in size. No palpable masses or lymphadenopathy, skin changes, or nipple drainage. ABDOMEN: Soft, nontender, nondistended. No organomegaly. PELVIC: Normal external female genitalia with areas of excoriations. Vagina is pink and rugated.  Normal discharge. Normal appearing cervix with IUD strings visualized at the os. Uterus is normal in size. No adnexal mass or tenderness. Chaperone present during the pelvic exam EXTREMITIES: No cyanosis, clubbing, or edema, 2+ distal pulses.     Assessment:    Healthy female exam.      Plan:    Pap smear  collected STI screening per patient request Patient referred to urogynecology for evaluation of urinary incontinence Discussed perineal care and avoidance of environmental factors which may trigger recurrent yeast infections. Patient encouraged to follow up with nutritionist given pre-diabetes state.  Patient will be contacted with abnormal results See After Visit Summary for Counseling Recommendations

## 2022-03-08 LAB — RPR: RPR Ser Ql: NONREACTIVE

## 2022-03-08 LAB — HEPATITIS B SURFACE ANTIGEN: Hepatitis B Surface Ag: NEGATIVE

## 2022-03-08 LAB — HIV ANTIBODY (ROUTINE TESTING W REFLEX): HIV Screen 4th Generation wRfx: NONREACTIVE

## 2022-03-08 LAB — HEPATITIS C ANTIBODY: Hep C Virus Ab: NONREACTIVE

## 2022-03-09 LAB — CERVICOVAGINAL ANCILLARY ONLY
Bacterial Vaginitis (gardnerella): POSITIVE — AB
Candida Glabrata: NEGATIVE
Candida Vaginitis: POSITIVE — AB
Chlamydia: NEGATIVE
Comment: NEGATIVE
Comment: NEGATIVE
Comment: NEGATIVE
Comment: NEGATIVE
Comment: NEGATIVE
Comment: NORMAL
Neisseria Gonorrhea: NEGATIVE
Trichomonas: NEGATIVE

## 2022-03-10 LAB — CYTOLOGY - PAP
Adequacy: ABSENT
Diagnosis: NEGATIVE

## 2022-03-11 ENCOUNTER — Other Ambulatory Visit: Payer: Self-pay | Admitting: Emergency Medicine

## 2022-03-11 MED ORDER — FLUCONAZOLE 150 MG PO TABS: 150.0000 mg | ORAL_TABLET | Freq: Once | ORAL | 0 refills | Status: AC

## 2022-03-11 MED ORDER — METRONIDAZOLE 500 MG PO TABS: 500.0000 mg | ORAL_TABLET | Freq: Two times a day (BID) | ORAL | 0 refills | Status: AC

## 2022-03-11 NOTE — Progress Notes (Signed)
TC to patient to inform of results, Rx.  

## 2022-03-11 NOTE — Addendum Note (Signed)
Addended by: Catalina Antigua on: 03/11/2022 08:35 AM   Modules accepted: Orders

## 2022-11-22 IMAGING — CT CT CERVICAL SPINE W/O CM
3 of 4 series · 13 of 33 positions shown, 16 images · non-contrast
Comparison: Brain MRI 06/14/2020.

CLINICAL DATA: Syncope, simple, abnormal neuro exam. Additional
history provided: Patient reports dizziness and headache, syncopal
episode after sitting up from an LP. Aphasia and slurred speech and
hypertension.

EXAM:
CT HEAD WITHOUT CONTRAST
CT CERVICAL SPINE WITHOUT CONTRAST
TECHNIQUE: Multidetector CT imaging of the head and cervical spine was
performed following the standard protocol without intravenous
contrast. Multiplanar CT image reconstructions of the cervical spine
were also generated.

[Series 6: orthogonal axial st · axial · 0.21mm/px · z∈[-272,-161]mm · 5 of 94 slices shown, 7 images]
[im 16/94  soft-tissue]
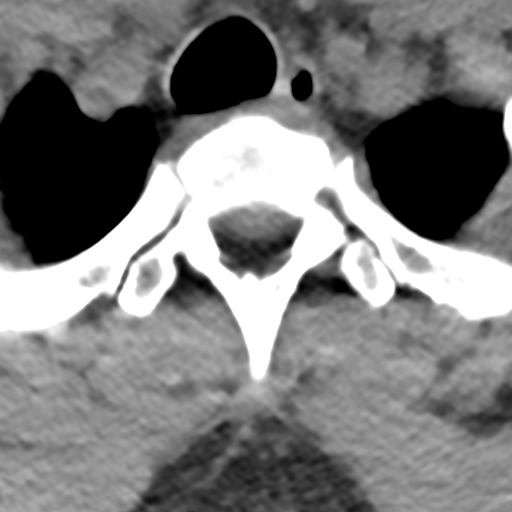
[im 16/94  bone]
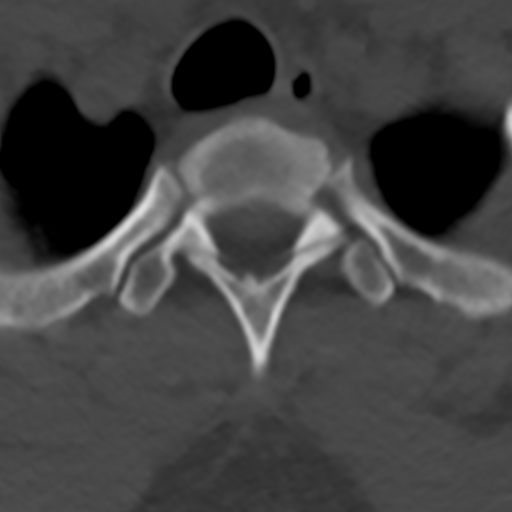
[im 32/94  bone]
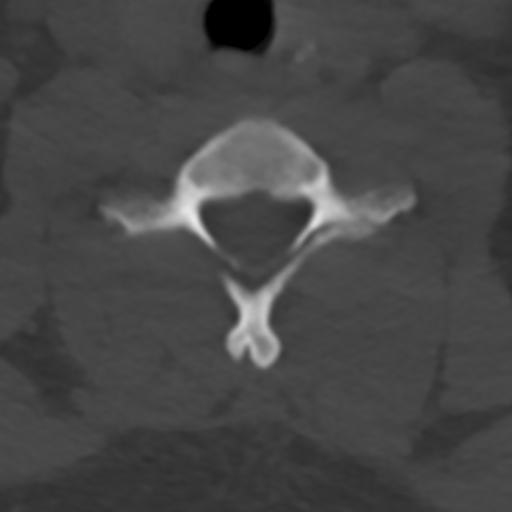
[im 47/94  bone]
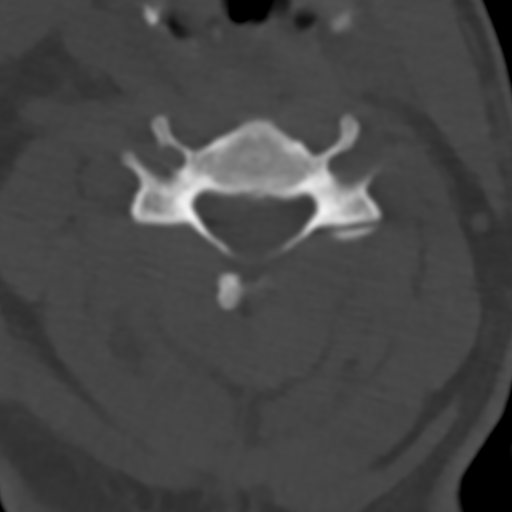
[im 63/94  bone]
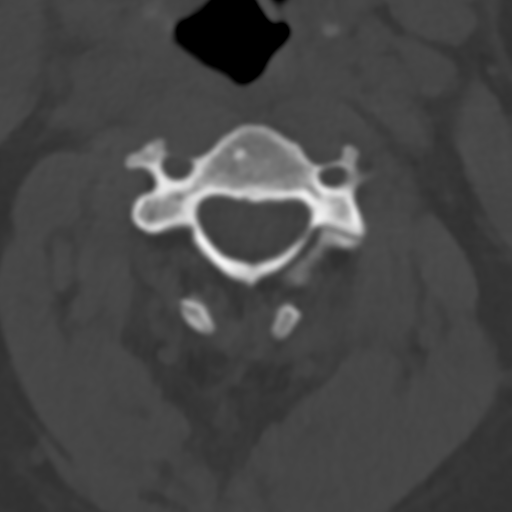
[im 78/94  soft-tissue]
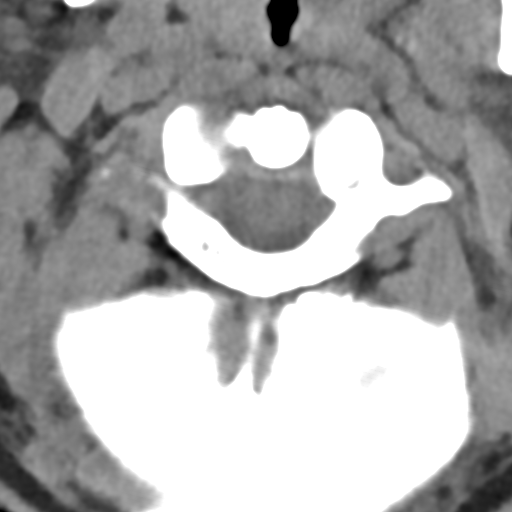
[im 78/94  bone]
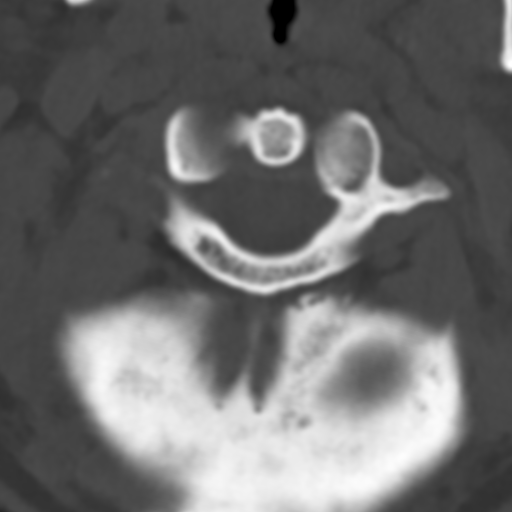

[Series 10: coronal bone · coronal · 0.27mm/px · 3 of 61 slices shown]
[im 13/61  bone]
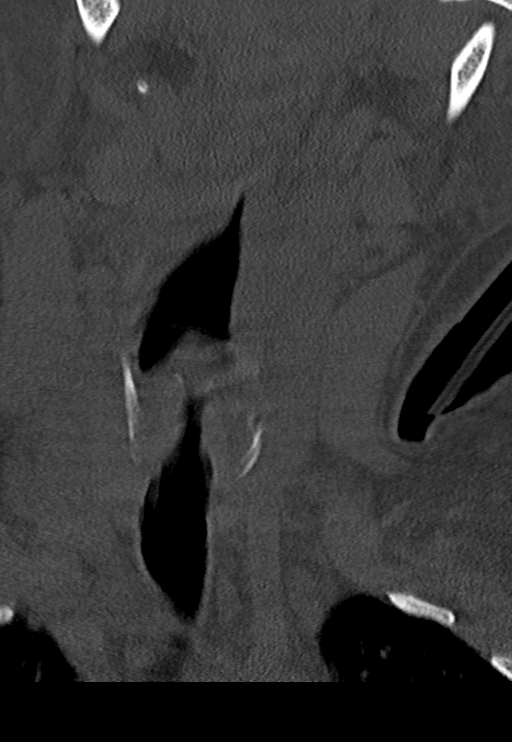
[im 25/61  bone]
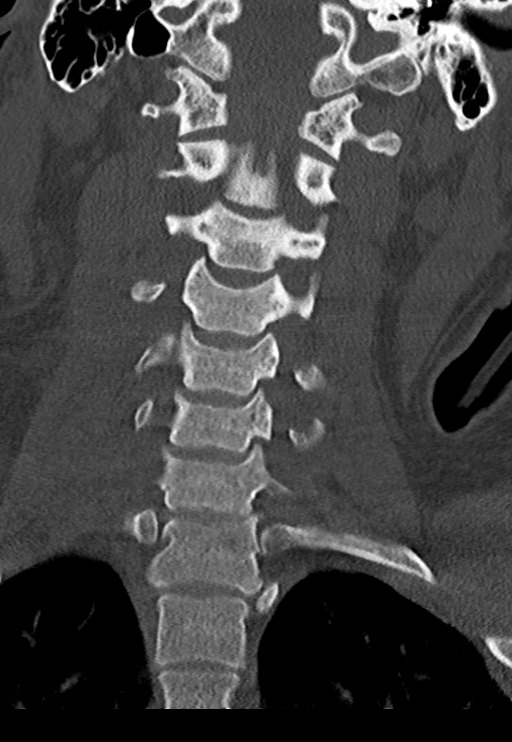
[im 37/61  bone]
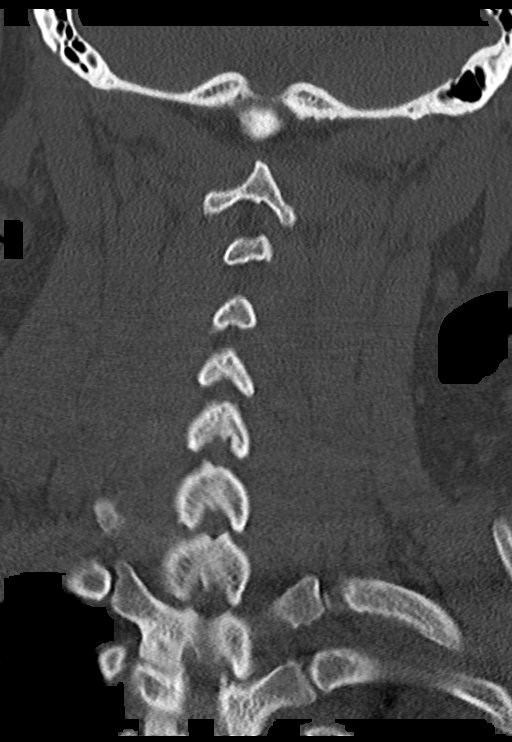

[Series 11: sagittal bone · sagittal · 0.31mm/px · 5 of 61 slices shown, 6 images]
[im 21/61  bone]
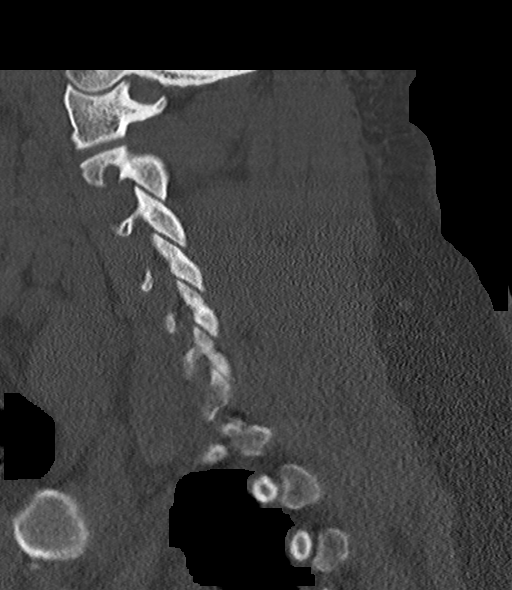
[im 26/61  bone]
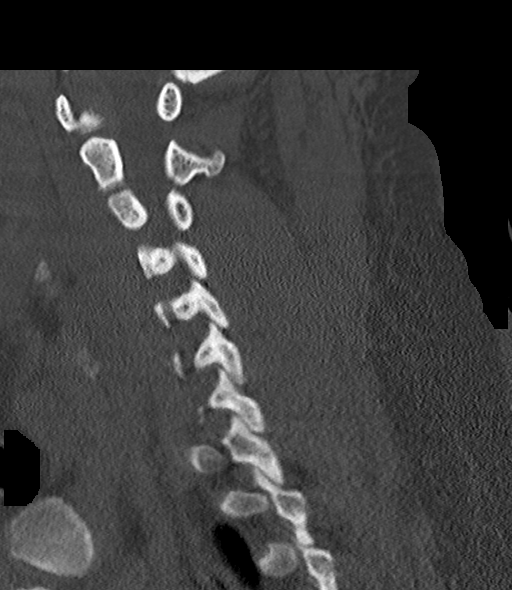
[im 31/61  soft-tissue]
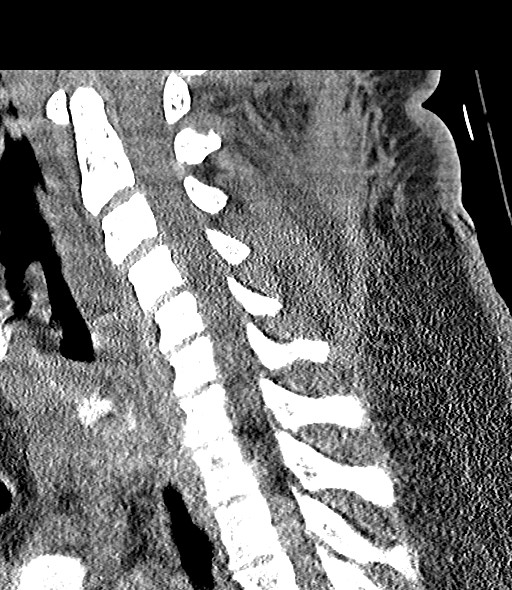
[im 31/61  bone]
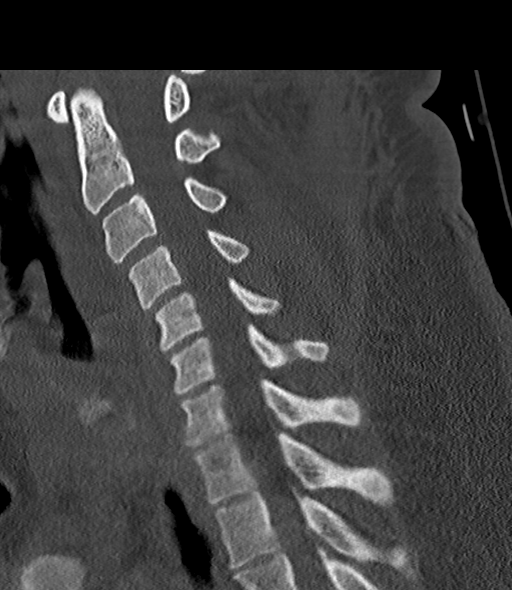
[im 36/61  bone]
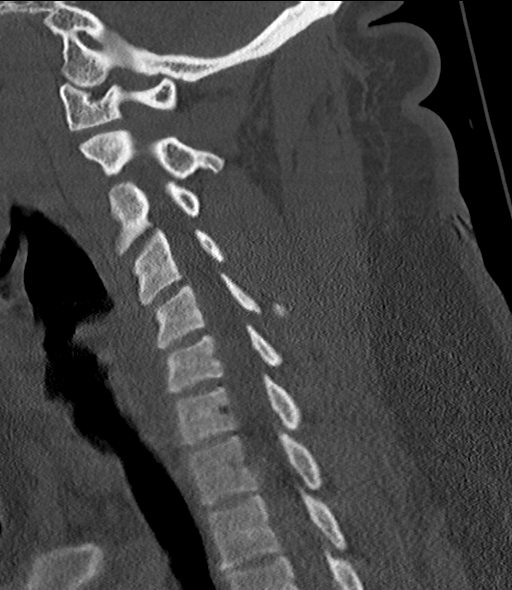
[im 41/61  bone]
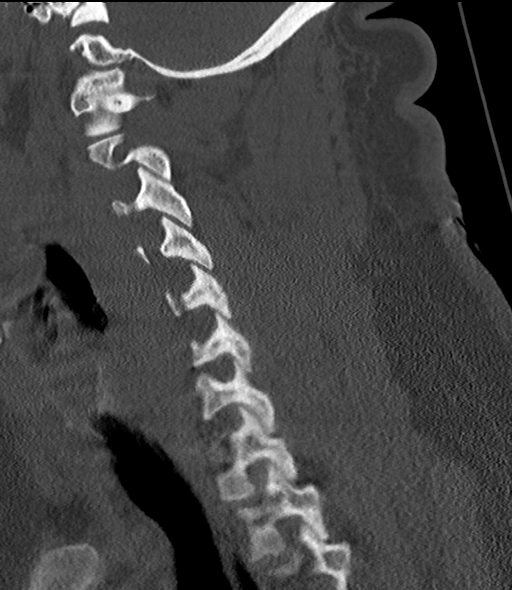

[13 of 33 positions shown; findings below may reference images not displayed]

FINDINGS: CT HEAD FINDINGS

Brain:

Cerebral volume is normal.

There is no acute intracranial hemorrhage.

No demarcated cortical infarct.

No extra-axial fluid collection.

No evidence of intracranial mass.

No midline shift.

Partially empty sella turcica.

Vascular: No hyperdense vessel.

Skull: Normal. Negative for fracture or focal lesion.

Sinuses/Orbits: Visualized orbits show no acute finding. Mild
mucosal thickening within the left frontal and bilateral ethmoid
sinuses. Trace bilateral maxillary sinus air-fluid levels.

CT CERVICAL SPINE FINDINGS

Alignment: Nonspecific reversal of the expected cervical lordosis.
No significant spondylolisthesis.

Skull base and vertebrae: The basion-dental and atlanto-dental
intervals are maintained.No evidence of acute fracture to the
cervical spine.

Soft tissues and spinal canal: No prevertebral fluid or swelling. No
visible canal hematoma.

Disc levels: No significant bony spinal canal or neural foraminal
narrowing at any level.

Upper chest: No consolidation within the imaged lung apices. No
visible pneumothorax.
IMPRESSION: CT head:

1. No evidence of acute intracranial abnormality.
2. Partially empty sella turcica. This finding is very commonly
incidental, but can be associated with idiopathic intracranial
hypertension.
3. Mild paranasal sinus disease at the imaged levels. Correlate for
acute sinusitis.

CT cervical spine:

1. No evidence of acute fracture to the cervical spine.
2. Nonspecific reversal of the expected cervical lordosis.

## 2023-09-11 IMAGING — US US PELVIS COMPLETE WITH TRANSVAGINAL
1 series · 15 of 25 positions shown · non-contrast
Comparison: 10/11/2012

CLINICAL DATA: Abnormal uterine bleeding with Mirena IUD, pain with
intercourse chronic but worsened in past few months, LMP 2798 pre
IUD

EXAM:
TRANSABDOMINAL AND TRANSVAGINAL ULTRASOUND OF PELVIS
TECHNIQUE: Both transabdominal and transvaginal ultrasound examinations of the
pelvis were performed. Transabdominal technique was performed for
global imaging of the pelvis including uterus, ovaries, adnexal
regions, and pelvic cul-de-sac. It was necessary to proceed with
endovaginal exam following the transabdominal exam to visualize the
endometrium, IUD.

[Series 1: us pelvis complete with transvaginal · 55 acquisitions, 15 frames shown]
[im 1/55]
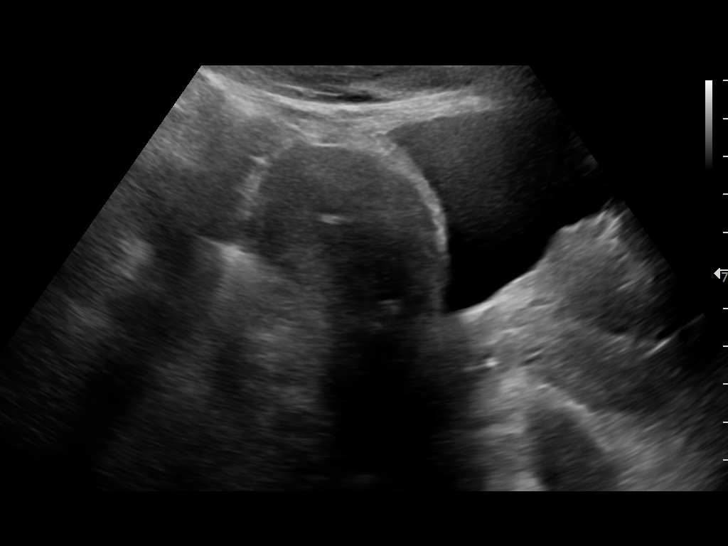
[im 5/55]
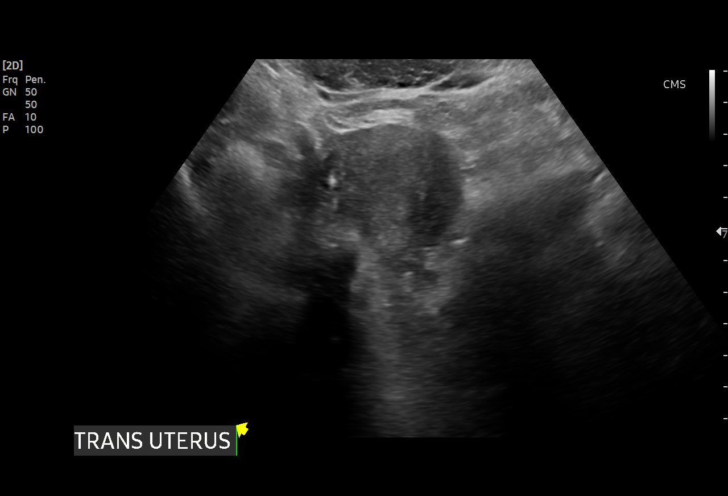
[im 10/55]
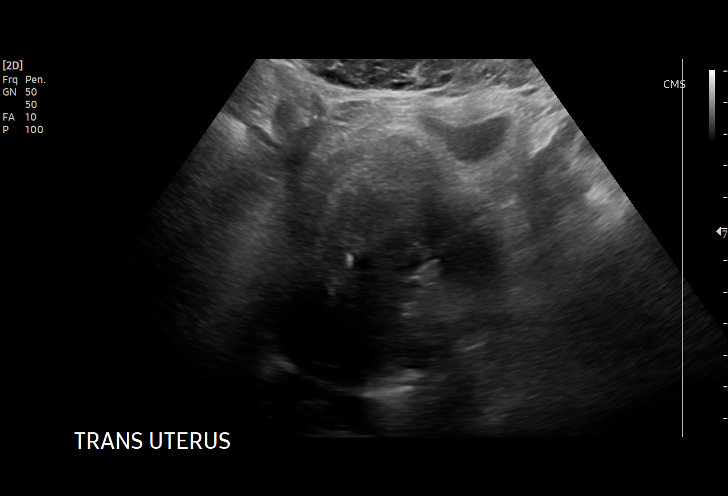
[im 12/55]
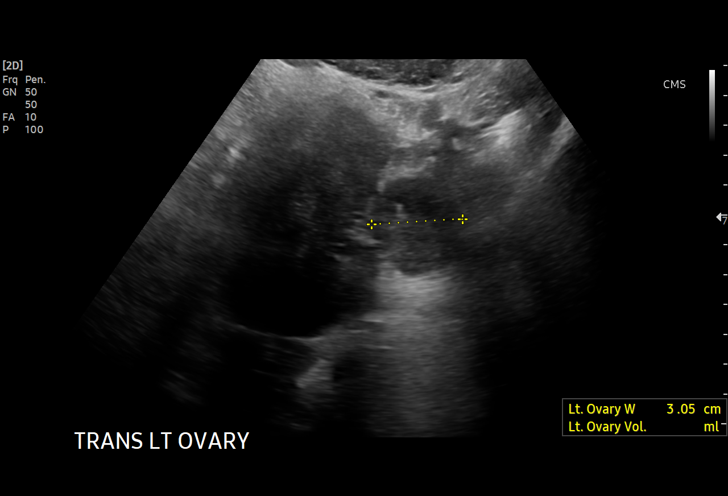
[im 16/55]
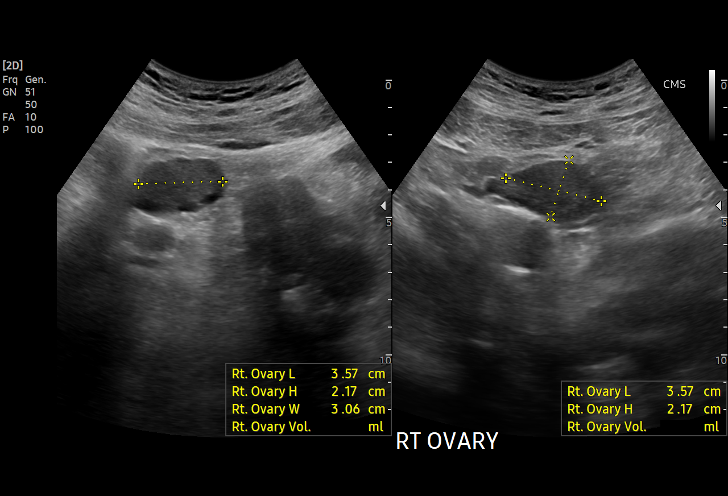
[im 21/55]
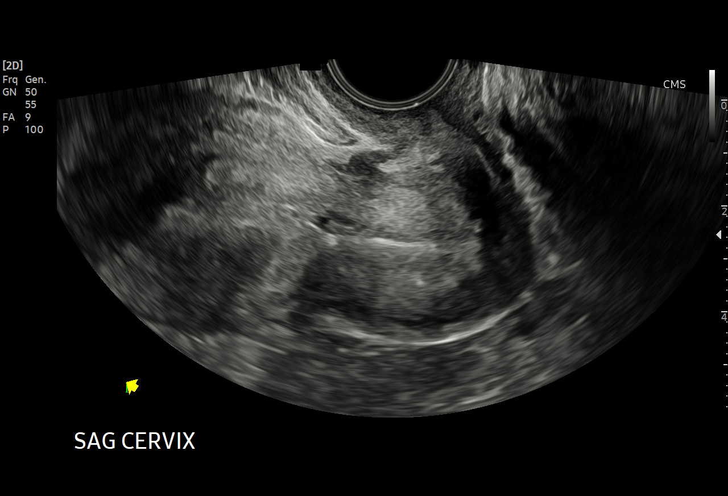
[im 23/55]
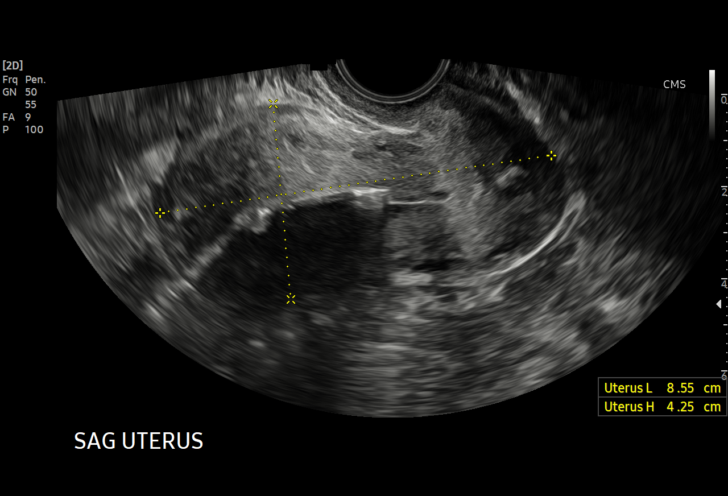
[im 28/55]
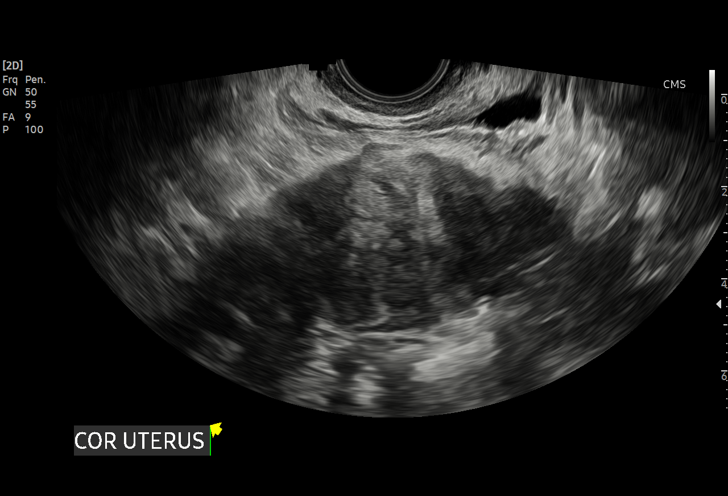
[im 32/55]
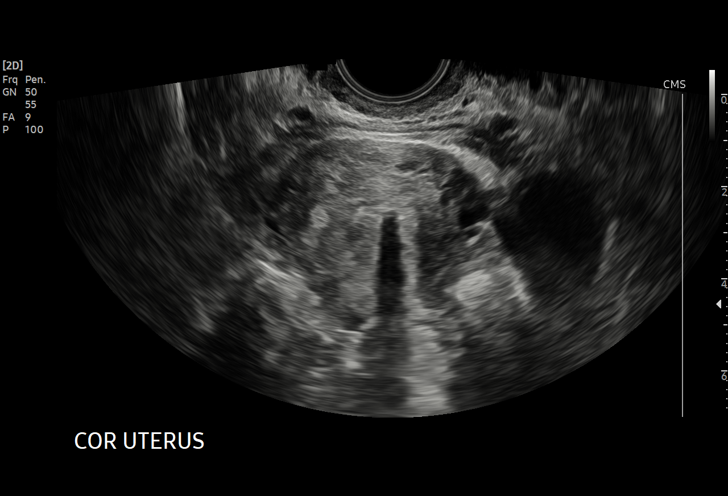
[im 34/55]
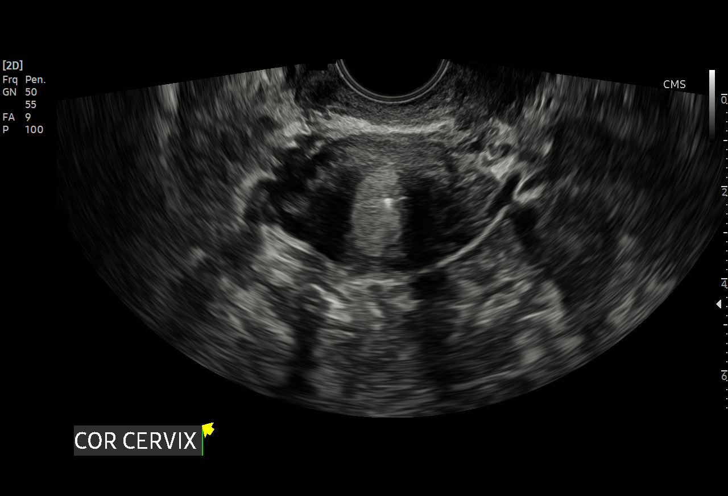
[im 39/55]
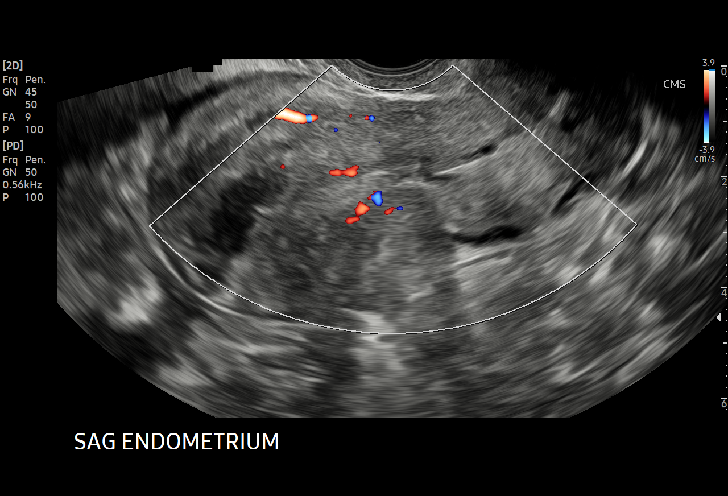
[im 43/55]
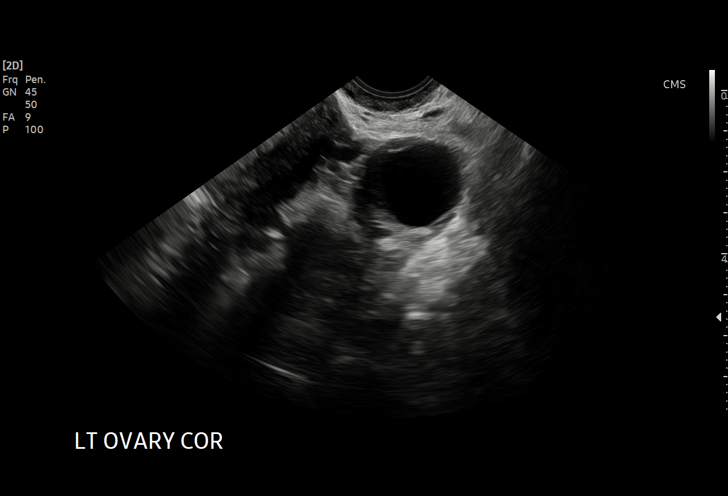
[im 46/55]
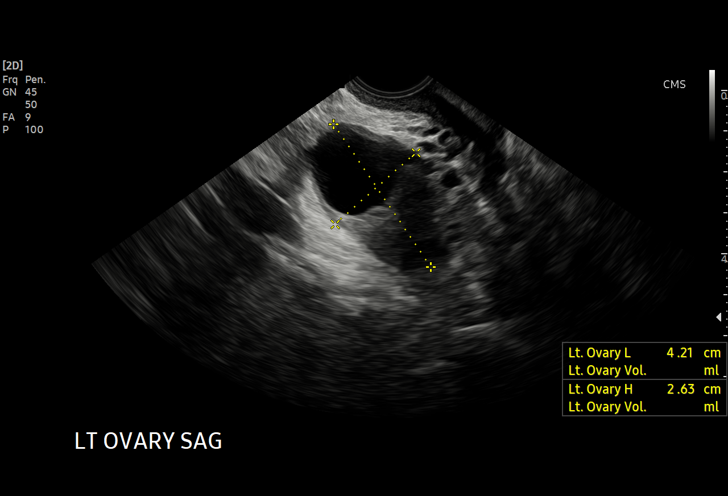
[im 50/55]
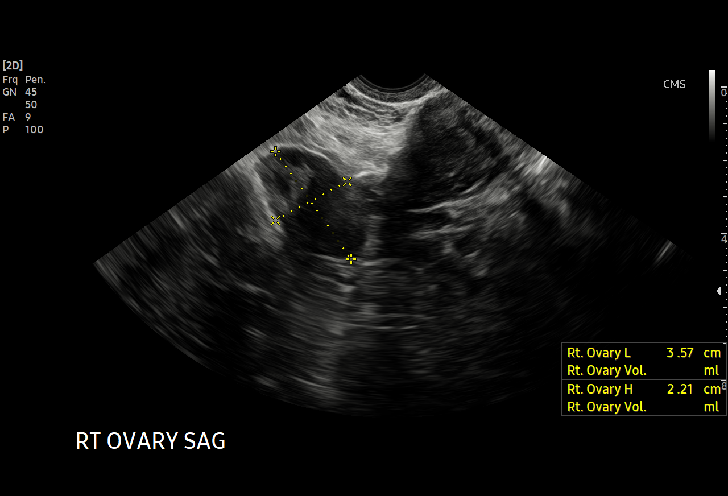
[im 55/55]
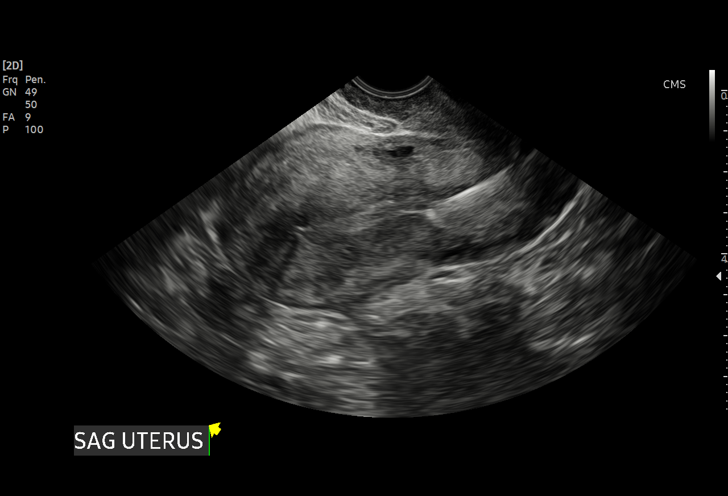

[15 of 25 positions shown; findings below may reference images not displayed]

FINDINGS: Uterus

Measurements: 8.6 x 4.3 x 5.2 cm = volume: 98 mL. Anteverted.
Heterogeneous myometrium. Tiny submucosal leiomyoma posterior upper
uterus 12 x 8 x 8 mm. No additional discrete mass.

Endometrium

Thickness: 5 mm. No endometrial fluid or mass. IUD in expected
position at upper uterine segment endometrial canal.

Right ovary

Measurements: 3.6 x 2.2 x 2.3 cm = volume: 9.6 mL. Normal morphology
without mass

Left ovary

Measurements: 4.2 x 2.4 x 2.9 cm = volume: 16.6 mL. Normal
morphology without mass

Other findings

No free pelvic fluid.  No adnexal masses.
IMPRESSION: IUD in expected position at upper uterine segment endometrial canal.

12 mm submucosal leiomyoma posterior upper uterus.

Remainder of exam unremarkable.

## 2024-02-21 ENCOUNTER — Telehealth: Payer: Self-pay

## 2024-02-21 NOTE — Telephone Encounter (Signed)
 Retruned call and pt stated that she has relocated to [facility] and wants date of IUD insertion.
